# Patient Record
Sex: Male | Born: 1975 | Race: White | Hispanic: No | Smoking: Current every day smoker
Health system: Southern US, Community
[De-identification: ages and names within clinical notes are randomized; demographics above are authoritative.]

## PROBLEM LIST (undated history)

## (undated) DIAGNOSIS — J45909 Unspecified asthma, uncomplicated: Secondary | ICD-10-CM

## (undated) DIAGNOSIS — F419 Anxiety disorder, unspecified: Secondary | ICD-10-CM

## (undated) DIAGNOSIS — F410 Panic disorder [episodic paroxysmal anxiety] without agoraphobia: Secondary | ICD-10-CM

---

## 2005-12-26 ENCOUNTER — Emergency Department (HOSPITAL_COMMUNITY): Admission: EM | Admit: 2005-12-26 | Discharge: 2005-12-26 | Payer: Self-pay | Admitting: Emergency Medicine

## 2006-01-19 ENCOUNTER — Emergency Department (HOSPITAL_COMMUNITY): Admission: EM | Admit: 2006-01-19 | Discharge: 2006-01-19 | Payer: Self-pay | Admitting: Emergency Medicine

## 2006-01-21 ENCOUNTER — Emergency Department (HOSPITAL_COMMUNITY): Admission: EM | Admit: 2006-01-21 | Discharge: 2006-01-21 | Payer: Self-pay | Admitting: Emergency Medicine

## 2006-02-23 ENCOUNTER — Emergency Department (HOSPITAL_COMMUNITY): Admission: EM | Admit: 2006-02-23 | Discharge: 2006-02-23 | Payer: Self-pay | Admitting: Emergency Medicine

## 2006-06-18 ENCOUNTER — Emergency Department (HOSPITAL_COMMUNITY): Admission: EM | Admit: 2006-06-18 | Discharge: 2006-06-18 | Payer: Self-pay | Admitting: Emergency Medicine

## 2006-07-11 ENCOUNTER — Emergency Department (HOSPITAL_COMMUNITY): Admission: EM | Admit: 2006-07-11 | Discharge: 2006-07-11 | Payer: Self-pay | Admitting: Emergency Medicine

## 2006-08-03 ENCOUNTER — Emergency Department (HOSPITAL_COMMUNITY): Admission: EM | Admit: 2006-08-03 | Discharge: 2006-08-03 | Payer: Self-pay | Admitting: Emergency Medicine

## 2006-08-06 ENCOUNTER — Emergency Department (HOSPITAL_COMMUNITY): Admission: EM | Admit: 2006-08-06 | Discharge: 2006-08-06 | Payer: Self-pay | Admitting: Emergency Medicine

## 2006-08-07 ENCOUNTER — Emergency Department (HOSPITAL_COMMUNITY): Admission: EM | Admit: 2006-08-07 | Discharge: 2006-08-07 | Payer: Self-pay | Admitting: Emergency Medicine

## 2006-08-31 ENCOUNTER — Emergency Department (HOSPITAL_COMMUNITY): Admission: EM | Admit: 2006-08-31 | Discharge: 2006-08-31 | Payer: Self-pay | Admitting: Emergency Medicine

## 2006-09-14 ENCOUNTER — Emergency Department (HOSPITAL_COMMUNITY): Admission: EM | Admit: 2006-09-14 | Discharge: 2006-09-14 | Payer: Self-pay | Admitting: Emergency Medicine

## 2006-09-21 ENCOUNTER — Emergency Department (HOSPITAL_COMMUNITY): Admission: EM | Admit: 2006-09-21 | Discharge: 2006-09-21 | Payer: Self-pay | Admitting: Emergency Medicine

## 2006-09-29 ENCOUNTER — Emergency Department (HOSPITAL_COMMUNITY): Admission: EM | Admit: 2006-09-29 | Discharge: 2006-09-29 | Payer: Self-pay | Admitting: Emergency Medicine

## 2006-11-07 ENCOUNTER — Emergency Department (HOSPITAL_COMMUNITY): Admission: EM | Admit: 2006-11-07 | Discharge: 2006-11-07 | Payer: Self-pay | Admitting: Emergency Medicine

## 2011-09-28 ENCOUNTER — Encounter (HOSPITAL_COMMUNITY): Payer: Self-pay | Admitting: Emergency Medicine

## 2011-09-28 ENCOUNTER — Emergency Department (HOSPITAL_COMMUNITY)
Admission: EM | Admit: 2011-09-28 | Discharge: 2011-09-28 | Disposition: A | Discharge status: Home / Self Care | Payer: Self-pay | Attending: Emergency Medicine | Admitting: Emergency Medicine

## 2011-09-28 DIAGNOSIS — J329 Chronic sinusitis, unspecified: Secondary | ICD-10-CM | POA: Insufficient documentation

## 2011-09-28 DIAGNOSIS — R000 Tachycardia, unspecified: Secondary | ICD-10-CM | POA: Insufficient documentation

## 2011-09-28 DIAGNOSIS — F17200 Nicotine dependence, unspecified, uncomplicated: Secondary | ICD-10-CM | POA: Insufficient documentation

## 2011-09-28 MED ORDER — PREDNISONE 20 MG PO TABS
60.0000 mg | ORAL_TABLET | Freq: Once | ORAL | Status: AC
  Administered 2011-09-28: 60 mg via ORAL
  Filled 2011-09-28: qty 3

## 2011-09-28 MED ORDER — PSEUDOEPHEDRINE HCL 60 MG PO TABS
60.0000 mg | ORAL_TABLET | Freq: Once | ORAL | Status: AC
  Administered 2011-09-28: 60 mg via ORAL
  Filled 2011-09-28: qty 1

## 2011-09-28 MED ORDER — PREDNISONE 10 MG PO TABS: 20.0000 mg | ORAL_TABLET | Freq: Every day | ORAL | Status: DC

## 2011-09-28 MED ORDER — PSEUDOEPHEDRINE HCL 60 MG PO TABS: ORAL_TABLET | ORAL | Status: DC

## 2011-09-28 NOTE — ED Notes (Signed)
Pt c/o sinus pain/pressure and congestion since yesterday.

## 2011-09-28 NOTE — ED Provider Notes (Signed)
History     CSN: [id number]  Arrival date & time 09/28/11  4782   First MD Initiated Contact with Patient 09/28/11 [phone]      Chief Complaint  Patient presents with  . Facial Pain  . Nasal Congestion    (Consider location/radiation/quality/duration/timing/severity/associated sxs/prior treatment) HPI Comments: Patient is a 48 hour history of increasing sinus pain and pressure. Patient states he mostly feels it in the 4 head and below the eyes. Patient states he has tried over-the-counter nasal decongestant medications which only helped minimally. This is now interfering with his ability to do his work and he presents to the emergency department for assistance. His been no fever or reported. The patient is not having bloody mucus that he is coughing up. His been no unusual rash.  The history is provided by the patient.    History reviewed. No pertinent past medical history.  History reviewed. No pertinent past surgical history.  No family history on file.  History  Substance Use Topics  . Smoking status: Current Everyday Smoker  . Smokeless tobacco: Not on file  . Alcohol Use: Yes     occasional      Review of Systems  Constitutional: Negative for activity change.       All ROS Neg except as noted in HPI  HENT: Positive for congestion. Negative for nosebleeds and neck pain.   Eyes: Negative for photophobia and discharge.  Respiratory: Negative for cough, shortness of breath and wheezing.   Cardiovascular: Negative for chest pain and palpitations.  Gastrointestinal: Negative for abdominal pain and blood in stool.  Genitourinary: Negative for dysuria, frequency and hematuria.  Musculoskeletal: Negative for back pain and arthralgias.  Skin: Negative.   Neurological: Positive for headaches. Negative for dizziness, seizures and speech difficulty.  Psychiatric/Behavioral: Negative for hallucinations and confusion.    Allergies  Review of patient's allergies indicates no  known allergies.  Home Medications   Current Outpatient Rx  Name Route Sig Dispense Refill  . PHENYLEPHRINE-ACETAMINOPHEN 5-325 MG PO CAPS Oral Take 2 capsules by mouth every 6 (six) hours as needed. For sinus congestion and pain    . PREDNISONE 10 MG PO TABS Oral Take 2 tablets (20 mg total) by mouth daily. 12 tablet 0  . PSEUDOEPHEDRINE HCL 60 MG PO TABS  1 po tid for congestion 30 tablet 0    BP 137/79  Pulse 110  Temp 98.6 F (37 C)  Resp 18  Ht 6' (1.829 m)  Wt 206 lb (93.441 kg)  BMI 27.94 kg/m2  SpO2 97%  Physical Exam  Nursing note and vitals reviewed. Constitutional: He is oriented to person, place, and time. He appears well-developed and well-nourished.  Non-toxic appearance.  HENT:  Head: Normocephalic.  Right Ear: Tympanic membrane and external ear normal.  Left Ear: Tympanic membrane and external ear normal.       Nasal congestion present bilaterally. There is slight increased redness of the uvula and the posterior pharynx, no exudate.  Eyes: EOM and lids are normal. Pupils are equal, round, and reactive to light.  Neck: Normal range of motion. Neck supple. Carotid bruit is not present.  Cardiovascular: Regular rhythm, normal heart sounds, intact distal pulses and normal pulses.  Tachycardia present.   Pulmonary/Chest: Breath sounds normal. No respiratory distress.  Abdominal: Soft. Bowel sounds are normal. There is no tenderness. There is no guarding.  Musculoskeletal: Normal range of motion.  Lymphadenopathy:       Head (right side): No submandibular adenopathy present.  Head (left side): No submandibular adenopathy present.    He has no cervical adenopathy.  Neurological: He is alert and oriented to person, place, and time. He has normal strength. No cranial nerve deficit or sensory deficit.  Skin: Skin is warm and dry.  Psychiatric: He has a normal mood and affect. His speech is normal.    ED Course  Procedures (including critical care time)  Labs  Reviewed - No data to display No results found.   1. Sinusitis       MDM  I have reviewed nursing notes, vital signs, and all appropriate lab and imaging results for this patient. Patient has history of previous sinus related issues. He has had sinus pain and pressure for the last 48 hours. The patient is treated this time with Sudafed 3 times daily and prednisone daily for the next 5-6 days. Patient is advised to increase fluids, and to wash hands frequently. He is to see his primary physician next week if not improving.       Kathie Dike, [place] 09/28/11 [phone]

## 2011-09-29 NOTE — ED Provider Notes (Signed)
Medical screening examination/treatment/procedure(s) were performed by non-physician practitioner and as supervising physician I was immediately available for consultation/collaboration.  Shelda Jakes, MD 09/29/11 [phone]

## 2011-10-28 ENCOUNTER — Emergency Department (HOSPITAL_COMMUNITY)
Admission: EM | Admit: 2011-10-28 | Discharge: 2011-10-28 | Disposition: A | Discharge status: Home / Self Care | Payer: Self-pay | Attending: Emergency Medicine | Admitting: Emergency Medicine

## 2011-10-28 ENCOUNTER — Encounter (HOSPITAL_COMMUNITY): Payer: Self-pay | Admitting: Emergency Medicine

## 2011-10-28 ENCOUNTER — Emergency Department (HOSPITAL_COMMUNITY): Payer: Self-pay

## 2011-10-28 DIAGNOSIS — F17200 Nicotine dependence, unspecified, uncomplicated: Secondary | ICD-10-CM | POA: Insufficient documentation

## 2011-10-28 DIAGNOSIS — R002 Palpitations: Secondary | ICD-10-CM | POA: Insufficient documentation

## 2011-10-28 DIAGNOSIS — R0789 Other chest pain: Secondary | ICD-10-CM | POA: Insufficient documentation

## 2011-10-28 DIAGNOSIS — F410 Panic disorder [episodic paroxysmal anxiety] without agoraphobia: Secondary | ICD-10-CM | POA: Insufficient documentation

## 2011-10-28 LAB — COMPREHENSIVE METABOLIC PANEL
AST: 37 U/L (ref 0–37)
CO2: 27 mEq/L (ref 19–32)
Calcium: 9.3 mg/dL (ref 8.4–10.5)
Creatinine, Ser: 0.95 mg/dL (ref 0.50–1.35)
GFR calc non Af Amer: >90 mL/min (ref >90)
Sodium: 134 mEq/L — ABNORMAL LOW (ref 135–145)
Total Protein: 7.4 g/dL (ref 6.0–8.3)

## 2011-10-28 LAB — D-DIMER, QUANTITATIVE: D-Dimer, Quant: 0.31 ug/mL-FEU (ref 0.00–0.48)

## 2011-10-28 LAB — CBC WITH DIFFERENTIAL/PLATELET
Basophils Absolute: 0 10*3/uL (ref 0.0–0.1)
Basophils Relative: 0 % (ref 0–1)
Eosinophils Absolute: 0.4 10*3/uL (ref 0.0–0.7)
Eosinophils Relative: 6 % — ABNORMAL HIGH (ref 0–5)
HCT: 44.8 % (ref 39.0–52.0)
Lymphocytes Relative: 23 % (ref 12–46)
MCH: 31.2 pg (ref 26.0–34.0)
MCHC: 35 g/dL (ref 30.0–36.0)
MCV: 89.1 fL (ref 78.0–100.0)
Monocytes Absolute: 0.6 10*3/uL (ref 0.1–1.0)
Platelets: 236 10*3/uL (ref 150–400)
RDW: 12.4 % (ref 11.5–15.5)

## 2011-10-28 LAB — RAPID URINE DRUG SCREEN, HOSP PERFORMED
Benzodiazepines: NOT DETECTED
Opiates: NOT DETECTED

## 2011-10-28 LAB — URINALYSIS, ROUTINE W REFLEX MICROSCOPIC
Ketones, ur: NEGATIVE mg/dL
Leukocytes, UA: NEGATIVE
Nitrite: NEGATIVE
Protein, ur: NEGATIVE mg/dL
pH: 6 (ref 5.0–8.0)

## 2011-10-28 MED ORDER — SODIUM CHLORIDE 0.9 % IV BOLUS (SEPSIS)
1000.0000 mL | Freq: Once | INTRAVENOUS | Status: AC
  Administered 2011-10-28: 1000 mL via INTRAVENOUS

## 2011-10-28 MED ORDER — LORAZEPAM 1 MG PO TABS
1.0000 mg | ORAL_TABLET | Freq: Once | ORAL | Status: AC
  Administered 2011-10-28: 1 mg via ORAL
  Filled 2011-10-28: qty 1

## 2011-10-28 NOTE — ED Notes (Signed)
Pt c/o tachycardia and chest discomfort that began earlier today. Pt states he felt like his "heart was going to beat out of my chest". Pt's HR upon EMS arrival was 140 and is now 120s. Pt states he's had similar symptoms previously but has never been treated.

## 2011-10-28 NOTE — ED Provider Notes (Signed)
History   This chart was scribed for Shane Octave, MD by Gerlean Ren. This patient was seen in room [facility]07/[facility]07 and the patient's care was started at 5:23PM.   CSN: [id number]  Arrival date & time 10/28/11  1714   First MD Initiated Contact with Patient 10/28/11 1720      Chief Complaint  Patient presents with  . Tachycardia    (Consider location/radiation/quality/duration/timing/severity/associated sxs/prior treatment) The history is provided by the patient. No language interpreter was used.   Shane Castillo is a 36 y.o. male brought in by ambulance, who presents to the Emergency Department complaining of racing heart rate with sudden onset 6 hours ago while driving described as similar in type, but more severe than, panic attacks pt has been having monthly for past year.  Pt immediately pulled over and had near-syncope (no LOC) when getting out of car.  Pt reports passenger's voice "sounded distant" when tachycardia started.  Per EMS HR was 140, currently 112.  Pt currently does not feel heart racing as before.  Pt reports drinking approx 60oz of coffee today, but that this is normal intake.  Pt was smoking when attack began.  Pt has not sought previous medical treatment for panic attacks.  Pt denies visual disturbances, changes in appetite or liquid intake, fever, neck pain, sore throat, CP, cough, SOB, abdominal pain, nausea, emesis, diarrhea, urinary symptoms, back pain, HA, numbness and rash as associated symptoms.  Pt is a current everyday smoker and reports occasional alcohol use.     History reviewed. No pertinent past medical history.  History reviewed. No pertinent past surgical history.  History reviewed. No pertinent family history.  History  Substance Use Topics  . Smoking status: Current Every Day Smoker  . Smokeless tobacco: Not on file  . Alcohol Use: Yes     occasional      Review of Systems A complete 10 system review of systems was obtained and all systems  are negative except as noted in the HPI and PMH.   Allergies  Review of patient's allergies indicates no known allergies.  Home Medications   Current Outpatient Rx  Name Route Sig Dispense Refill  . PHENYLEPHRINE-ACETAMINOPHEN 5-325 MG PO CAPS Oral Take 2 capsules by mouth every 6 (six) hours as needed. For sinus congestion and pain    . PREDNISONE 10 MG PO TABS Oral Take 2 tablets (20 mg total) by mouth daily. 12 tablet 0  . PSEUDOEPHEDRINE HCL 60 MG PO TABS  1 po tid for congestion 30 tablet 0    BP 112/66  Pulse 93  Resp 20  SpO2 96%  Physical Exam  Nursing note and vitals reviewed. Constitutional: He is oriented to person, place, and time. He appears well-developed.  HENT:  Head: Normocephalic and atraumatic.  Eyes: Conjunctivae normal are normal.  Neck: Normal range of motion. No tracheal deviation present.  Cardiovascular: Regular rhythm and normal heart sounds.   No murmur heard.      Tachycardic.    Pulmonary/Chest: Effort normal and breath sounds normal. He has no wheezes.  Abdominal: Soft. There is no tenderness.  Musculoskeletal: Normal range of motion. He exhibits no edema.  Neurological: He is alert and oriented to person, place, and time.  Skin: Skin is warm.  Psychiatric: He has a normal mood and affect.    ED Course  Procedures (including critical care time)  DIAGNOSTIC STUDIES: Oxygen Saturation is 96% on room air, normal by my interpretation.    COORDINATION OF  CARE: 5:30PM- Ordered ativan, CBC, c-met, troponin, d-dimer, urinalysis, TSH, T4, and chest XR.     Labs Reviewed - No data to display No results found. Results for orders placed during the hospital encounter of 10/28/11  CBC WITH DIFFERENTIAL      Component Value Range   WBC 7.6  4.0 - 10.5 K/uL   RBC 5.03  4.22 - 5.81 MIL/uL   Hemoglobin 15.7  13.0 - 17.0 g/dL   HCT 16.1  09.6 - 04.5 %   MCV 89.1  78.0 - 100.0 fL   MCH 31.2  26.0 - 34.0 pg   MCHC 35.0  30.0 - 36.0 g/dL   RDW 40.9   81.1 - 91.4 %   Platelets 236  150 - 400 K/uL   Neutrophils Relative 64  43 - 77 %   Neutro Abs 4.8  1.7 - 7.7 K/uL   Lymphocytes Relative 23  12 - 46 %   Lymphs Abs 1.8  0.7 - 4.0 K/uL   Monocytes Relative 7  3 - 12 %   Monocytes Absolute 0.6  0.1 - 1.0 K/uL   Eosinophils Relative 6 (*) 0 - 5 %   Eosinophils Absolute 0.4  0.0 - 0.7 K/uL   Basophils Relative 0  0 - 1 %   Basophils Absolute 0.0  0.0 - 0.1 K/uL  COMPREHENSIVE METABOLIC PANEL      Component Value Range   Sodium 134 (*) 135 - 145 mEq/L   Potassium 3.5  3.5 - 5.1 mEq/L   Chloride 99  96 - 112 mEq/L   CO2 27  19 - 32 mEq/L   Glucose, Bld 116 (*) 70 - 99 mg/dL   BUN 17  6 - 23 mg/dL   Creatinine, Ser 7.82  0.50 - 1.35 mg/dL   Calcium 9.3  8.4 - 95.6 mg/dL   Total Protein 7.4  6.0 - 8.3 g/dL   Albumin 3.7  3.5 - 5.2 g/dL   AST 37  0 - 37 U/L   ALT 39  0 - 53 U/L   Alkaline Phosphatase 93  39 - 117 U/L   Total Bilirubin 0.2 (*) 0.3 - 1.2 mg/dL   GFR calc non Af Amer >90  >90 mL/min   GFR calc Af Amer >90  >90 mL/min  TROPONIN I      Component Value Range   Troponin I <0.30  <0.30 ng/mL  D-DIMER, QUANTITATIVE      Component Value Range   D-Dimer, Quant 0.31  0.00 - 0.48 ug/mL-FEU  URINALYSIS, ROUTINE W REFLEX MICROSCOPIC      Component Value Range   Color, Urine YELLOW  YELLOW   APPearance CLEAR  CLEAR   Specific Gravity, Urine >1.030 (*) 1.005 - 1.030   pH 6.0  5.0 - 8.0   Glucose, UA NEGATIVE  NEGATIVE mg/dL   Hgb urine dipstick NEGATIVE  NEGATIVE   Bilirubin Urine NEGATIVE  NEGATIVE   Ketones, ur NEGATIVE  NEGATIVE mg/dL   Protein, ur NEGATIVE  NEGATIVE mg/dL   Urobilinogen, UA 0.2  0.0 - 1.0 mg/dL   Nitrite NEGATIVE  NEGATIVE   Leukocytes, UA NEGATIVE  NEGATIVE    Dg Chest 2 View  10/28/2011  *RADIOLOGY REPORT*  Clinical Data: Tachycardia.  Chest discomfort.  CHEST - 2 VIEW  Comparison: None.  Findings: Lateral costophrenic angles omitted.  Posterior costophrenic angles appear sharp.  Lungs appear  clear. Cardiac and mediastinal contours appear unremarkable.  IMPRESSION:  1.  No significant  abnormality identified.   Original Report Authenticated By: Dellia Cloud, M.D.     No diagnosis found.    MDM  Palpitations, racing heart, nausea, diaphoresis, vision changes x 10 minutes, now resolved.  No CP or SOB.  Hx similar "attacks" monthly but never lasted this long. Heavy caffeine use, denies drug use or med changes.  Not taking sudafed prescribed last week.  EKG was sinus tachycardia. Chest x-ray negative. Troponin negative, d-dimer negative. Heart rate is improved spontaneously from 110s to 80s after at home. Patient no distress. Counseled on the nature of symptoms. Advised to slowly decrease caffeine intake establish care with PCP.   Date: 10/28/2011  Rate: 104  Rhythm: sinus tachycardia  QRS Axis: normal  Intervals: normal  ST/T Wave abnormalities: nonspecific ST/T changes  Conduction Disutrbances:none  Narrative Interpretation:   Old EKG Reviewed: none available    I personally performed the services described in this documentation, which was scribed in my presence.  The recorded information has been reviewed and considered.       Shane Octave, MD 10/28/11 1944

## 2011-10-29 LAB — TSH: TSH: 0.673 u[IU]/mL (ref 0.350–4.500)

## 2011-10-29 LAB — T4, FREE: Free T4: 1.09 ng/dL (ref 0.80–1.80)

## 2012-05-10 ENCOUNTER — Encounter (HOSPITAL_COMMUNITY): Payer: Self-pay | Admitting: Emergency Medicine

## 2012-05-10 ENCOUNTER — Emergency Department (HOSPITAL_COMMUNITY)
Admission: EM | Admit: 2012-05-10 | Discharge: 2012-05-10 | Disposition: A | Discharge status: Home / Self Care | Payer: Self-pay | Attending: Emergency Medicine | Admitting: Emergency Medicine

## 2012-05-10 DIAGNOSIS — IMO0001 Reserved for inherently not codable concepts without codable children: Secondary | ICD-10-CM | POA: Insufficient documentation

## 2012-05-10 DIAGNOSIS — Z8659 Personal history of other mental and behavioral disorders: Secondary | ICD-10-CM | POA: Insufficient documentation

## 2012-05-10 DIAGNOSIS — R509 Fever, unspecified: Secondary | ICD-10-CM | POA: Insufficient documentation

## 2012-05-10 DIAGNOSIS — J020 Streptococcal pharyngitis: Secondary | ICD-10-CM | POA: Insufficient documentation

## 2012-05-10 DIAGNOSIS — R05 Cough: Secondary | ICD-10-CM | POA: Insufficient documentation

## 2012-05-10 DIAGNOSIS — F17200 Nicotine dependence, unspecified, uncomplicated: Secondary | ICD-10-CM | POA: Insufficient documentation

## 2012-05-10 DIAGNOSIS — R059 Cough, unspecified: Secondary | ICD-10-CM | POA: Insufficient documentation

## 2012-05-10 DIAGNOSIS — R51 Headache: Secondary | ICD-10-CM | POA: Insufficient documentation

## 2012-05-10 DIAGNOSIS — J45909 Unspecified asthma, uncomplicated: Secondary | ICD-10-CM | POA: Insufficient documentation

## 2012-05-10 HISTORY — DX: Panic disorder (episodic paroxysmal anxiety): F41.0

## 2012-05-10 HISTORY — DX: Anxiety disorder, unspecified: F41.9

## 2012-05-10 HISTORY — DX: Unspecified asthma, uncomplicated: J45.909

## 2012-05-10 MED ORDER — AMOXICILLIN 500 MG PO CAPS: 500.0000 mg | ORAL_CAPSULE | Freq: Three times a day (TID) | ORAL | Status: DC

## 2012-05-10 MED ORDER — IBUPROFEN 800 MG PO TABS
800.0000 mg | ORAL_TABLET | Freq: Once | ORAL | Status: AC
  Administered 2012-05-10: 800 mg via ORAL
  Filled 2012-05-10: qty 1

## 2012-05-10 MED ORDER — PENICILLIN V POTASSIUM 250 MG PO TABS
500.0000 mg | ORAL_TABLET | Freq: Once | ORAL | Status: AC
  Administered 2012-05-10: 500 mg via ORAL
  Filled 2012-05-10: qty 2

## 2012-05-10 NOTE — ED Notes (Signed)
Patient c/o sore throat with productive cough and sinus pressure. Per patient sputum dark and thick. Patient reports taking ibuprofen. Denies taking any this morning. Per patient possible fever yesterday.

## 2012-05-10 NOTE — ED Notes (Signed)
Patient with no complaints at this time. Respirations even and unlabored. Skin warm/dry. Discharge instructions reviewed with patient at this time. Patient given opportunity to voice concerns/ask questions. Patient discharged at this time and left Emergency Department with steady gait.   

## 2012-05-10 NOTE — ED Provider Notes (Signed)
History     CSN: [id number]  Arrival date & time 05/10/12  0847   First MD Initiated Contact with Patient 05/10/12 [phone]      Chief Complaint  Patient presents with  . Sore Throat    (Consider location/radiation/quality/duration/timing/severity/associated sxs/prior treatment) Patient is a 37 y.o. male presenting with pharyngitis. The history is provided by the patient.  Sore Throat This is a new problem. The current episode started in the past 7 days. The problem occurs constantly. The problem has been unchanged. Associated symptoms include arthralgias, a fever and headaches. Pertinent negatives include no abdominal pain, chest pain, coughing, neck pain or rash. The symptoms are aggravated by swallowing and coughing. Treatments tried: OTC cough meds.    Past Medical History  Diagnosis Date  . Asthma   . Panic attack   . Anxiety     History reviewed. No pertinent past surgical history.  History reviewed. No pertinent family history.  History  Substance Use Topics  . Smoking status: Current Every Day Smoker -- 0.75 packs/day for 20 years    Types: Cigarettes  . Smokeless tobacco: Never Used  . Alcohol Use: Yes     Comment: occasional      Review of Systems  Constitutional: Positive for fever. Negative for activity change.       All ROS Neg except as noted in HPI  HENT: Negative for nosebleeds and neck pain.   Eyes: Negative for photophobia and discharge.  Respiratory: Negative for cough, shortness of breath and wheezing.   Cardiovascular: Negative for chest pain and palpitations.  Gastrointestinal: Negative for abdominal pain and blood in stool.  Genitourinary: Negative for dysuria, frequency and hematuria.  Musculoskeletal: Positive for arthralgias. Negative for back pain.  Skin: Negative.  Negative for rash.  Neurological: Positive for headaches. Negative for dizziness, seizures and speech difficulty.  Psychiatric/Behavioral: Negative for hallucinations and  confusion.    Allergies  Review of patient's allergies indicates no known allergies.  Home Medications   Current Outpatient Rx  Name  Route  Sig  Dispense  Refill  . DM-Doxylamine-Acetaminophen (NYQUIL COLD & FLU PO)   Oral   Take 10 mLs by mouth at bedtime as needed.         Marland Kitchen ibuprofen (ADVIL,MOTRIN) 200 MG tablet   Oral   Take 800 mg by mouth every 8 (eight) hours as needed for pain.           BP 128/82  Pulse 98  Temp(Src) 98.7 F (37.1 C) (Oral)  Resp 16  Ht 6' (1.829 m)  Wt 215 lb (97.523 kg)  BMI 29.15 kg/m2  SpO2 98%  Physical Exam  Nursing note and vitals reviewed. Constitutional: He is oriented to person, place, and time. He appears well-developed and well-nourished.  Non-toxic appearance.  HENT:  Head: Normocephalic.  Right Ear: Tympanic membrane and external ear normal.  Left Ear: Tympanic membrane and external ear normal.  Mouth/Throat: Edematous present. Posterior oropharyngeal erythema present.  Eyes: EOM and lids are normal. Pupils are equal, round, and reactive to light.  Neck: Normal range of motion. Neck supple. Carotid bruit is not present.  Cardiovascular: Normal rate, regular rhythm, normal heart sounds, intact distal pulses and normal pulses.   Pulmonary/Chest: Breath sounds normal. No respiratory distress.  Abdominal: Soft. Bowel sounds are normal. There is no tenderness. There is no guarding.  Musculoskeletal: Normal range of motion.  Lymphadenopathy:       Head (right side): No submandibular adenopathy present.  Head (left side): No submandibular adenopathy present.    He has no cervical adenopathy.  Neurological: He is alert and oriented to person, place, and time. He has normal strength. No cranial nerve deficit or sensory deficit.  Skin: Skin is warm and dry.  Psychiatric: He has a normal mood and affect. His speech is normal.    ED Course  Procedures (including critical care time)  Labs Reviewed  RAPID STREP SCREEN -  Abnormal; Notable for the following:    Streptococcus, Group A Screen (Direct) POSITIVE (*)    All other components within normal limits   No results found.   No diagnosis found.    MDM  I have reviewed nursing notes, vital signs, and all appropriate lab and imaging results for this patient. Pt presents to ED with complaint of sore throat, cough, and generally not felling well. Vital signs stable. Strep test is positive. Plan- Pt advised to wash hands frequently. Use amoxil tid. Salt water gargles. Pt to return to ED or see PCP for recheck if not improving.      Kathie Dike, PA-C 05/13/12 1422

## 2012-05-13 NOTE — ED Provider Notes (Signed)
Medical screening examination/treatment/procedure(s) were performed by non-physician practitioner and as supervising physician I was immediately available for consultation/collaboration.   Joya Gaskins, MD 05/13/12 [phone]

## 2014-09-18 ENCOUNTER — Emergency Department (HOSPITAL_COMMUNITY)
Admission: EM | Admit: 2014-09-18 | Discharge: 2014-09-18 | Disposition: A | Discharge status: Home / Self Care | Payer: Self-pay | Attending: Emergency Medicine | Admitting: Emergency Medicine

## 2014-09-18 ENCOUNTER — Encounter (HOSPITAL_COMMUNITY): Payer: Self-pay | Admitting: *Deleted

## 2014-09-18 DIAGNOSIS — Z720 Tobacco use: Secondary | ICD-10-CM | POA: Insufficient documentation

## 2014-09-18 DIAGNOSIS — K088 Other specified disorders of teeth and supporting structures: Secondary | ICD-10-CM | POA: Insufficient documentation

## 2014-09-18 DIAGNOSIS — K0889 Other specified disorders of teeth and supporting structures: Secondary | ICD-10-CM

## 2014-09-18 DIAGNOSIS — K0381 Cracked tooth: Secondary | ICD-10-CM | POA: Insufficient documentation

## 2014-09-18 DIAGNOSIS — Z8659 Personal history of other mental and behavioral disorders: Secondary | ICD-10-CM | POA: Insufficient documentation

## 2014-09-18 DIAGNOSIS — Z792 Long term (current) use of antibiotics: Secondary | ICD-10-CM | POA: Insufficient documentation

## 2014-09-18 DIAGNOSIS — J45909 Unspecified asthma, uncomplicated: Secondary | ICD-10-CM | POA: Insufficient documentation

## 2014-09-18 MED ORDER — CEPHALEXIN 500 MG PO CAPS: 500.0000 mg | ORAL_CAPSULE | Freq: Two times a day (BID) | ORAL | Status: DC

## 2014-09-18 NOTE — ED Notes (Signed)
Pt c/o pain to his wisdom teeth.

## 2014-09-18 NOTE — ED Provider Notes (Signed)
CSN: [id number]     Arrival date & time 09/18/14  0631 History   First MD Initiated Contact with Patient 09/18/14 0636     Chief Complaint  Patient presents with  . Dental Pain     (Consider location/radiation/quality/duration/timing/severity/associated sxs/prior Treatment) HPI Patient presents with worsening dental pain after eating last night. Complains of mild facial swelling. No fever or chills. Patient's been taking ibuprofen at home with minimal relief. Past Medical History  Diagnosis Date  . Asthma   . Panic attack   . Anxiety    History reviewed. No pertinent past surgical history. History reviewed. No pertinent family history. Social History  Substance Use Topics  . Smoking status: Current Every Day Smoker -- 0.75 packs/day for 20 years    Types: Cigarettes  . Smokeless tobacco: Never Used  . Alcohol Use: Yes     Comment: occasional    Review of Systems  Constitutional: Negative for fever and chills.  HENT: Positive for dental problem.   All other systems reviewed and are negative.     Allergies  Aspirin  Home Medications   Prior to Admission medications   Medication Sig Start Date End Date Taking? Authorizing Provider  amoxicillin (AMOXIL) 500 MG capsule Take 1 capsule (500 mg total) by mouth 3 (three) times daily. 05/10/12   Ivery Quale, PA-C  cephALEXin (KEFLEX) 500 MG capsule Take 1 capsule (500 mg total) by mouth 2 (two) times daily. 09/18/14   Loren Racer, MD  DM-Doxylamine-Acetaminophen (NYQUIL COLD & FLU PO) Take 10 mLs by mouth at bedtime as needed.    Historical Provider, MD  ibuprofen (ADVIL,MOTRIN) 200 MG tablet Take 800 mg by mouth every 8 (eight) hours as needed for pain.    Historical Provider, MD   BP 155/98 mmHg  Pulse 83  Temp(Src) 98.3 F (36.8 C) (Oral)  Resp 16  Ht  (1.753 m)  Wt 225 lb (102.059 kg)  BMI 33.21 kg/m2  SpO2 98% Physical Exam  Constitutional: He is oriented to person, place, and time. He appears  well-developed and well-nourished. No distress.  HENT:  Head: Normocephalic and atraumatic.  Mouth/Throat: Oropharynx is clear and moist.    Eyes: EOM are normal. Pupils are equal, round, and reactive to light.  Neck: Normal range of motion. Neck supple.  Cardiovascular: Normal rate.   Pulmonary/Chest: Effort normal.  Abdominal: Soft.  Musculoskeletal: Normal range of motion. He exhibits no edema or tenderness.  Neurological: He is alert and oriented to person, place, and time.  Ambulating without difficulty  Skin: Skin is warm and dry. No rash noted. No erythema.  Psychiatric: He has a normal mood and affect. His behavior is normal.  Nursing note and vitals reviewed.   ED Course  Procedures (including critical care time) Labs Review Labs Reviewed - No data to display  Imaging Review No results found. I have personally reviewed and evaluated these images and lab results as part of my medical decision-making.   EKG Interpretation None      MDM   Final diagnoses:  Pain, dental    Patient was offered dental block but refused. I advised follow-up with his dentist for extraction of his molars. He may also take Tylenol in addition to his ibuprofen.    Loren Racer, MD 09/18/14 [phone]

## 2014-09-18 NOTE — Discharge Instructions (Signed)

## 2016-04-18 ENCOUNTER — Emergency Department (HOSPITAL_COMMUNITY)
Admission: EM | Admit: 2016-04-18 | Discharge: 2016-04-18 | Disposition: A | Discharge status: Home / Self Care | Payer: Self-pay | Attending: Emergency Medicine | Admitting: Emergency Medicine

## 2016-04-18 ENCOUNTER — Encounter (HOSPITAL_COMMUNITY): Payer: Self-pay

## 2016-04-18 DIAGNOSIS — M545 Low back pain: Secondary | ICD-10-CM | POA: Insufficient documentation

## 2016-04-18 DIAGNOSIS — J45909 Unspecified asthma, uncomplicated: Secondary | ICD-10-CM | POA: Insufficient documentation

## 2016-04-18 DIAGNOSIS — F17210 Nicotine dependence, cigarettes, uncomplicated: Secondary | ICD-10-CM | POA: Insufficient documentation

## 2016-04-18 DIAGNOSIS — Z79899 Other long term (current) drug therapy: Secondary | ICD-10-CM | POA: Insufficient documentation

## 2016-04-18 MED ORDER — METHOCARBAMOL 500 MG PO TABS
1500.0000 mg | ORAL_TABLET | ORAL | Status: AC
  Administered 2016-04-18: 1500 mg via ORAL
  Filled 2016-04-18: qty 3

## 2016-04-18 MED ORDER — LIDOCAINE 5 % EX PTCH: 1.0000 | MEDICATED_PATCH | CUTANEOUS | 0 refills | Status: DC

## 2016-04-18 NOTE — ED Provider Notes (Signed)
[facility] DEPT Provider Note   CSN: [id number] Arrival date & time: 04/18/16  0906     History   Chief Complaint Chief Complaint  Patient presents with  . Back Pain    HPI Shane Castillo is a 41 y.o. male.  HPI   Pt with [facility] presents today with increase in left lower back pain that began when he was picking up wood to bring in the house.  States when he stood up from a bent over position he felt a "catch" in his back.This occurred at 6:30am.  He took 800mg  ibuprofen without improvement.  The pain radiates into the left leg.   Denies fevers, chills, abdominal pain, loss of control of bowel or bladder, weakness of numbness of the extremities, saddle anesthesia, bowel or urinary complaints.  No hx CA, IVDU.    [facility] but has not filled it because he is waiting for his check to come in (should happen today).    Past Medical History:  Diagnosis Date  . Anxiety   . Asthma   . Panic attack     There are no active problems to display for this patient.   History reviewed. No pertinent surgical history.     Home Medications    Prior to Admission medications   Medication Sig Start Date End Date Taking? Authorizing Provider  amoxicillin (AMOXIL) 500 MG capsule Take 1 capsule (500 mg total) by mouth 3 (three) times daily. 05/10/12   Ivery QualeHobson Bryant, PA-C  cephALEXin (KEFLEX) 500 MG capsule Take 1 capsule (500 mg total) by mouth 2 (two) times daily. 09/18/14   Loren Raceravid Yelverton, MD  DM-Doxylamine-Acetaminophen (NYQUIL COLD & FLU PO) Take 10 mLs by mouth at bedtime as needed.    Historical Provider, MD  ibuprofen (ADVIL,MOTRIN) 200 MG tablet Take 800 mg by mouth every 8 (eight) hours as needed for pain.    Historical Provider, MD  lidocaine (LIDODERM) 5 % Place 1 patch onto the skin daily. Remove & Discard patch within 12 hours or as directed by MD 04/18/16   Trixie DredgeEmily Quynn Vilchis,  PA-C    Family History No family history on file.  Social History Social History  Substance Use Topics  . Smoking status: Current Every Day Smoker    Packs/day: 0.75    Years: 20.00    Types: Cigarettes  . Smokeless tobacco: Never Used  . Alcohol use Yes     Comment: occasional     Allergies   Aspirin   Review of Systems Review of Systems  Constitutional: Negative for appetite change, chills and fever.  Gastrointestinal: Negative for abdominal pain, blood in stool, constipation and diarrhea.  Genitourinary: Negative for dysuria, frequency and urgency.  Musculoskeletal: Positive for back pain. Negative for gait problem.  Skin: Negative for color change and wound.  Allergic/Immunologic: Negative for immunocompromised state.  Neurological: Negative for weakness and numbness.  Hematological: Does not bruise/bleed easily.     Physical Exam Updated Vital Signs BP 130/84 (BP Location: Left Arm)   Pulse 88   Temp 98.3 F (36.8 C) (Oral)   Resp 18   Ht 6' (1.829 m)   Wt 90.7 kg   SpO2 99%   BMI 27.12 kg/m   Physical Exam  Constitutional: He appears well-developed and well-nourished. No distress.  HENT:  Head: Normocephalic and atraumatic.  Neck: Neck supple.  Pulmonary/Chest: Effort normal.  Abdominal: Soft. He exhibits  no distension. There is no tenderness. There is no rebound and no guarding.  Musculoskeletal:       Lumbar back: He exhibits tenderness, pain and spasm. He exhibits no bony tenderness.       Back:  Spine nontender, no crepitus, or stepoffs. Lower extremities:  Strength 5/5, sensation intact, distal pulses intact.     Neurological: He is alert.  Gait is normal.   Skin: He is not diaphoretic.  Nursing note and vitals reviewed.    ED Treatments / Results  Labs (all labs ordered are listed, but only abnormal results are displayed) Labs Reviewed - No data to display  EKG  EKG Interpretation None       Radiology No results  found.  Procedures Procedures (including critical care time)  Medications Ordered in ED Medications  methocarbamol (ROBAXIN) tablet 1,500 mg (1,500 mg Oral Given 04/18/16 0933)     Initial Impression / Assessment and Plan / ED Course  I have reviewed the triage vital signs and the nursing notes.  Pertinent labs & imaging results that were available during my care of the patient were reviewed by me and considered in my medical decision making (see chart for details).   MRI and recent ED visit not available through care everywhere  Afebrile nontoxic patient with known lumbar disc problem p/w left lower back pain after bending over to pick up heavy log.  No red flags on exam.  Pt has definite reproducible tenderness and spasm in the musculature.  No neurologic symptoms or deficits.  Pt has flexeril prescription he has not yet filled.  Robaxin given in ED.  D/C home with coupon for flexeril he was previously prescribed, Rx lidoderm patches, continued motrin and advised conservative treatments at home.  PCP follow up.  Discussed result, findings, treatment, and follow up  with patient.  Pt given return precautions.  Pt verbalizes understanding and agrees with plan.      Final Clinical Impressions(s) / ED Diagnoses   Final diagnoses:  Acute left-sided low back pain, with sciatica presence unspecified    New Prescriptions Discharge Medication List as of 04/18/2016  9:39 AM    START taking these medications   Details  lidocaine (LIDODERM) 5 % Place 1 patch onto the skin daily. Remove & Discard patch within 12 hours or as directed by MD, Starting Fri 04/18/2016, Print         [place], PA-C 04/18/16 1020    Marily Memos, MD 04/19/16 [phone]

## 2016-04-18 NOTE — Discharge Instructions (Signed)
Read the information below.  Use the prescribed medication as directed.  Please discuss all new medications with your pharmacist.  You may return to the Emergency Department at any time for worsening condition or any new symptoms that concern you.  If you develop fevers, loss of control of bowel or bladder, weakness or numbness in your legs, or are unable to walk, return to the ER for a recheck.   Continue to take 800mg  (4 pills) of ibuprofen three times daily with food.  Fill your flexeril prescription when you can.  I have attached a coupon for you.  Use ice/heat and gentle stretching - see attached information for recommendations.

## 2016-04-18 NOTE — ED Notes (Signed)
Pt made aware to return if symptoms worsen or if any life threatening symptoms occur.   
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
## 2016-07-03 DIAGNOSIS — F410 Panic disorder [episodic paroxysmal anxiety] without agoraphobia: Secondary | ICD-10-CM | POA: Insufficient documentation

## 2016-07-03 DIAGNOSIS — Z720 Tobacco use: Secondary | ICD-10-CM | POA: Insufficient documentation

## 2016-07-04 ENCOUNTER — Ambulatory Visit: Payer: Self-pay | Admitting: Family Medicine

## 2016-08-30 ENCOUNTER — Emergency Department (HOSPITAL_COMMUNITY): Payer: Self-pay

## 2016-08-30 ENCOUNTER — Encounter (HOSPITAL_COMMUNITY): Payer: Self-pay | Admitting: Cardiology

## 2016-08-30 ENCOUNTER — Emergency Department (HOSPITAL_COMMUNITY)
Admission: EM | Admit: 2016-08-30 | Discharge: 2016-08-30 | Disposition: A | Discharge status: Home / Self Care | Payer: Self-pay | Attending: Emergency Medicine | Admitting: Emergency Medicine

## 2016-08-30 DIAGNOSIS — R079 Chest pain, unspecified: Secondary | ICD-10-CM | POA: Insufficient documentation

## 2016-08-30 DIAGNOSIS — R059 Cough, unspecified: Secondary | ICD-10-CM

## 2016-08-30 DIAGNOSIS — R05 Cough: Secondary | ICD-10-CM | POA: Insufficient documentation

## 2016-08-30 DIAGNOSIS — R61 Generalized hyperhidrosis: Secondary | ICD-10-CM | POA: Insufficient documentation

## 2016-08-30 DIAGNOSIS — J45909 Unspecified asthma, uncomplicated: Secondary | ICD-10-CM | POA: Insufficient documentation

## 2016-08-30 DIAGNOSIS — F17210 Nicotine dependence, cigarettes, uncomplicated: Secondary | ICD-10-CM | POA: Insufficient documentation

## 2016-08-30 LAB — CBC WITH DIFFERENTIAL/PLATELET
BASOS ABS: 0 10*3/uL (ref 0.0–0.1)
BASOS PCT: 0 %
EOS ABS: 0.6 10*3/uL (ref 0.0–0.7)
EOS PCT: 7 %
HCT: 51.9 % (ref 39.0–52.0)
Hemoglobin: 18.3 g/dL — ABNORMAL HIGH (ref 13.0–17.0)
LYMPHS PCT: 19 %
Lymphs Abs: 1.7 10*3/uL (ref 0.7–4.0)
MCH: 33.2 pg (ref 26.0–34.0)
MCHC: 35.3 g/dL (ref 30.0–36.0)
MCV: 94 fL (ref 78.0–100.0)
Monocytes Absolute: 0.7 10*3/uL (ref 0.1–1.0)
Monocytes Relative: 8 %
Neutro Abs: 5.9 10*3/uL (ref 1.7–7.7)
Neutrophils Relative %: 66 %
PLATELETS: 240 10*3/uL (ref 150–400)
RBC: 5.52 MIL/uL (ref 4.22–5.81)
RDW: 12.1 % (ref 11.5–15.5)
WBC: 9 10*3/uL (ref 4.0–10.5)

## 2016-08-30 LAB — COMPREHENSIVE METABOLIC PANEL
ALBUMIN: 4.1 g/dL (ref 3.5–5.0)
ALT: 28 U/L (ref 17–63)
AST: 25 U/L (ref 15–41)
Alkaline Phosphatase: 73 U/L (ref 38–126)
Anion gap: 7 (ref 5–15)
BUN: 12 mg/dL (ref 6–20)
CHLORIDE: 103 mmol/L (ref 101–111)
CO2: 29 mmol/L (ref 22–32)
CREATININE: 0.89 mg/dL (ref 0.61–1.24)
Calcium: 9.3 mg/dL (ref 8.9–10.3)
GFR calc Af Amer: >60 mL/min (ref >60)
GFR calc non Af Amer: >60 mL/min (ref >60)
GLUCOSE: 91 mg/dL (ref 65–99)
Potassium: 3.9 mmol/L (ref 3.5–5.1)
SODIUM: 139 mmol/L (ref 135–145)
Total Bilirubin: 0.7 mg/dL (ref 0.3–1.2)
Total Protein: 7.7 g/dL (ref 6.5–8.1)

## 2016-08-30 LAB — D-DIMER, QUANTITATIVE: D-Dimer, Quant: <0.27 ug/mL-FEU (ref 0.00–0.50)

## 2016-08-30 LAB — TROPONIN I: Troponin I: <0.03 ng/mL (ref <0.03)

## 2016-08-30 MED ORDER — PREDNISONE 50 MG PO TABS
60.0000 mg | ORAL_TABLET | Freq: Once | ORAL | Status: AC
  Administered 2016-08-30: 18:00:00 60 mg via ORAL
  Filled 2016-08-30: qty 1

## 2016-08-30 MED ORDER — DOXYCYCLINE HYCLATE 100 MG PO CAPS: 100.0000 mg | ORAL_CAPSULE | Freq: Two times a day (BID) | ORAL | 0 refills | Status: DC

## 2016-08-30 MED ORDER — KETOROLAC TROMETHAMINE 30 MG/ML IJ SOLN
15.0000 mg | Freq: Once | INTRAMUSCULAR | Status: AC
  Administered 2016-08-30: 15 mg via INTRAVENOUS
  Filled 2016-08-30: qty 1

## 2016-08-30 MED ORDER — IOPAMIDOL (ISOVUE-300) INJECTION 61%
75.0000 mL | Freq: Once | INTRAVENOUS | Status: AC | PRN
  Administered 2016-08-30: 75 mL via INTRAVENOUS

## 2016-08-30 MED ORDER — IPRATROPIUM-ALBUTEROL 0.5-2.5 (3) MG/3ML IN SOLN
3.0000 mL | Freq: Once | RESPIRATORY_TRACT | Status: AC
  Administered 2016-08-30: 3 mL via RESPIRATORY_TRACT
  Filled 2016-08-30: qty 3

## 2016-08-30 MED ORDER — AZITHROMYCIN 250 MG PO TABS
500.0000 mg | ORAL_TABLET | Freq: Once | ORAL | Status: AC
  Administered 2016-08-30: 500 mg via ORAL
  Filled 2016-08-30: qty 2

## 2016-08-30 MED ORDER — ALBUTEROL SULFATE HFA 108 (90 BASE) MCG/ACT IN AERS
2.0000 | INHALATION_SPRAY | Freq: Once | RESPIRATORY_TRACT | Status: AC
  Administered 2016-08-30: 2 via RESPIRATORY_TRACT
  Filled 2016-08-30: qty 6.7

## 2016-08-30 MED ORDER — SODIUM CHLORIDE 0.9 % IV BOLUS (SEPSIS)
1000.0000 mL | Freq: Once | INTRAVENOUS | Status: AC
  Administered 2016-08-30: 1000 mL via INTRAVENOUS

## 2016-08-30 MED ORDER — PREDNISONE 20 MG PO TABS: ORAL_TABLET | ORAL | 0 refills | Status: DC

## 2016-08-30 NOTE — ED Provider Notes (Signed)
[facility] Provider Note   CSN: [id number] Arrival date & time: 08/30/16  1530     History   Chief Complaint Chief Complaint  Patient presents with  . Chest Pain    HPI Shane Castillo is a 41 y.o. male.  HPI   41 year old male with past medical history of smoking, alcohol abuse, marijuana use and asthma who presents to the emergency department with 2-3 days of progressively worsening left-sided chest pain associated with 2 or 3 small spots under his left axilla. Review of systems patient also states that he has pretty consistent night sweats for the last few weeks. No fevers. Some cough. Does have a history of being incarcerated in jail and in prison for ovarian length of time. Denies any other drug use. Denies any skin color changes.  Past Medical History:  Diagnosis Date  . Anxiety   . Asthma   . Panic attack     Patient Active Problem List   Diagnosis Date Noted  . Panic attacks 07/03/2016  . Tobacco abuse 07/03/2016    History reviewed. No pertinent surgical history.     Home Medications    Prior to Admission medications   Medication Sig Start Date End Date Taking? Authorizing Provider  aspirin-acetaminophen-caffeine (EXCEDRIN MIGRAINE) [phone] MG tablet Take 2 tablets by mouth once.   Yes [provider]  Aspirin-Salicylamide-Caffeine (BC HEADACHE) 325-95-16 MG TABS Take 1 packet by mouth daily as needed (for pain).   Yes [provider]  ibuprofen (ADVIL,MOTRIN) 200 MG tablet Take 800 mg by mouth every 8 (eight) hours as needed for pain.   Yes [provider]  doxycycline (VIBRAMYCIN) 100 MG capsule Take 1 capsule (100 mg total) by mouth 2 (two) times daily. One po bid x 7 days 08/30/16   Deonta Bomberger, Barbara CowerJason, MD  predniSONE (DELTASONE) 20 MG tablet 2 tabs po daily x 4 days 08/30/16   Reaghan Kawa, Barbara CowerJason, MD    Family History History reviewed. No pertinent family history.  Social History Social History  Substance Use Topics  .  Smoking status: Current Every Day Smoker    Packs/day: 0.75    Years: 20.00    Types: Cigarettes  . Smokeless tobacco: Never Used  . Alcohol use Yes     Comment: occasional     Allergies   Aspirin   Review of Systems Review of Systems  All other systems reviewed and are negative.    Physical Exam Updated Vital Signs BP 120/80   Pulse 92   Temp 98.6 F (37 C) (Oral)   Resp 18   Ht 6' (1.829 m)   Wt 99.8 kg (220 lb)   SpO2 95%   BMI 29.84 kg/m   Physical Exam  Constitutional: He appears well-developed and well-nourished.  HENT:  Head: Normocephalic and atraumatic.  Couple swollen lymph nodes in left axilla  Eyes: Conjunctivae and EOM are normal.  Neck: Normal range of motion.  Cardiovascular: Tachycardia present.   Pulmonary/Chest: Effort normal. Tachypnea noted. No respiratory distress. He has decreased breath sounds.  Abdominal: Soft. He exhibits no distension.  Musculoskeletal: Normal range of motion.  Neurological: He is alert.  Skin: Skin is warm and dry.  Nursing note and vitals reviewed.    ED Treatments / Results  Labs (all labs ordered are listed, but only abnormal results are displayed) Labs Reviewed  CBC WITH DIFFERENTIAL/PLATELET - Abnormal; Notable for the following:       Result Value   Hemoglobin 18.3 (*)  All other components within normal limits  COMPREHENSIVE METABOLIC PANEL  TROPONIN I  D-DIMER, QUANTITATIVE (NOT AT [facility])    EKG  EKG Interpretation  Date/Time:  Saturday August 30 2016 15:43:34 EDT Ventricular Rate:  103 PR Interval:    QRS Duration: 96 QT Interval:  316 QTC Calculation: 414 R Axis:   75 Text Interpretation:  Sinus tachycardia Probable left atrial enlargement Left ventricular hypertrophy ST elevation, consider anterior injury Baseline wander in lead(s) I III aVR aVL aVF Technically poor tracing needs repeat. no charge Confirmed by Marily Memos [phone]) on 08/30/2016 3:48:25 PM       Radiology Dg Chest 2  View  Result Date: 08/30/2016 CLINICAL DATA:  LEFT chest pain for 2-3 days, shortness of breath today. History of asthma, smoker. EXAM: CHEST  2 VIEW COMPARISON:  Chest radiograph October 28, 2011 FINDINGS: Cardiomediastinal silhouette is normal. No pleural effusions or focal consolidations. Mild bronchitic changes. Trachea projects midline and there is no pneumothorax. Soft tissue planes and included osseous structures are non-suspicious. IMPRESSION: Mild bronchitic changes without focal consolidation. Electronically Signed   By: Awilda Metro M.D.   On: 08/30/2016 16:34   Ct Chest W Contrast  Result Date: 08/30/2016 CLINICAL DATA:  Chest pain for 2-3 days. Sore lumps under left arm today. History of asthma. EXAM: CT CHEST WITH CONTRAST TECHNIQUE: Multidetector CT imaging of the chest was performed during intravenous contrast administration. CONTRAST:  75mL ISOVUE-300 IOPAMIDOL (ISOVUE-300) INJECTION 61% COMPARISON:  None. FINDINGS: Cardiovascular: Thoracic aorta is normal in caliber and configuration. No aneurysm or dissection. Heart size is normal. No pericardial effusion. No evidence of central obstructing pulmonary embolism. Mediastinum/Nodes: Scattered small lymph nodes within the mediastinum and perihilar regions. No masses or enlarged lymph nodes. Esophagus is unremarkable. Trachea and central bronchi are unremarkable. Lungs/Pleura: Lungs are clear.  No pleural effusion or pneumothorax. Upper Abdomen: Scattered small lymph nodes within the upper abdomen and upper retroperitoneum, none of which are pathologic by CT size criteria. Limited images of the upper abdomen are otherwise unremarkable. Musculoskeletal: No acute or suspicious osseous finding. Skin thickening near the left axilla, incompletely imaged, with mild edema in the underlying subcutaneous soft tissues, suggesting cellulitis. Similar findings on the right as well. No enlarged lymph nodes within the axillary regions. IMPRESSION: 1. No acute  intrathoracic abnormality. No pneumonia or pulmonary edema. Heart size is normal. 2. Small lymph nodes within the mediastinum and upper abdomen, slightly more numerous than typically seen but none are pathologic by CT size criteria. If clinical/laboratory findings suggest non neoplastic process, per consensus guidelines, recommend short-term follow-up CT of the chest and abdomen in 3 months to ensure stability or resolution. 3. Skin thickening within the bilateral axillary regions, with mild underlying subcutaneous edema on the left, suggesting localized cellulitis. No enlarged lymph nodes within either axilla. Electronically Signed   By: Bary Richard M.D.   On: 08/30/2016 18:24    Procedures Procedures (including critical care time)  Medications Ordered in ED Medications  ipratropium-albuterol (DUONEB) 0.5-2.5 (3) MG/3ML nebulizer solution 3 mL (3 mLs Nebulization Given 08/30/16 1614)  ketorolac (TORADOL) 30 MG/ML injection 15 mg (15 mg Intravenous Given 08/30/16 1618)  sodium chloride 0.9 % bolus 1,000 mL (0 mLs Intravenous Stopped 08/30/16 1844)  predniSONE (DELTASONE) tablet 60 mg (60 mg Oral Given 08/30/16 1750)  azithromycin (ZITHROMAX) tablet 500 mg (500 mg Oral Given 08/30/16 1750)  iopamidol (ISOVUE-300) 61 % injection 75 mL (75 mLs Intravenous Contrast Given 08/30/16 1803)  albuterol (PROVENTIL HFA;VENTOLIN HFA)  108 (90 Base) MCG/ACT inhaler 2 puff (2 puffs Inhalation Given 08/30/16 1846)     Initial Impression / Assessment and Plan / ED Course  I have reviewed the triage vital signs and the nursing notes.  Pertinent labs & imaging results that were available during my care of the patient were reviewed by me and considered in my medical decision making (see chart for details).  Will work up for PE/ACS/pneumonia. Possibly just bronchitis, will try duoneb.     Patient with multiple enlarged lymph nodes on his chest CT. His leg related to his bronchitis however does have a family history of some  type of lymph node cancer in his father at age 42. Patient will undergo a course of antibiotics and follow-up with his primary doctor in a few months for repeat CT scan or return here if worsening symptoms or not improving symptoms for further management.  Final Clinical Impressions(s) / ED Diagnoses   Final diagnoses:  Cough  Chest pain, unspecified type    New Prescriptions Discharge Medication List as of 08/30/2016  6:32 PM    START taking these medications   Details  doxycycline (VIBRAMYCIN) 100 MG capsule Take 1 capsule (100 mg total) by mouth 2 (two) times daily. One po bid x 7 days, Starting Sat 08/30/2016, Print    predniSONE (DELTASONE) 20 MG tablet 2 tabs po daily x 4 days, Print         Dallon Dacosta, Barbara CowerJason, MD 08/30/16 [phone]

## 2016-08-30 NOTE — ED Triage Notes (Signed)
Chest pain 2-3 days.  Noticed sore lumps under left arm today.

## 2016-08-30 NOTE — ED Notes (Signed)
Returned from X-Ray.

## 2016-08-30 NOTE — Discharge Instructions (Signed)
You had multiple swollen lymph nodes in your chest and axilla. This is likely related to the bronchitis/infection you are being treated for but need to be followed up with repeat CT scan in 3 months to make sure they are improving and not related to some type of cancerous process.

## 2016-09-04 ENCOUNTER — Other Ambulatory Visit (HOSPITAL_COMMUNITY): Payer: Self-pay | Admitting: Oncology

## 2016-09-04 DIAGNOSIS — R590 Localized enlarged lymph nodes: Secondary | ICD-10-CM | POA: Insufficient documentation

## 2016-09-04 NOTE — ED Provider Notes (Signed)
Called patient back and spoke with his girlfriend/emergency contact who accompanied him to the hospital. He is still having night sweats, cough, low energy, not feeling well and chest pain even after 6 days of antibiotics and feels like he is getting worse. This in concert with questionable findings on CT scan, will refer to oncology. Girlfriend and patient will work on appointment today and call if any problems. I will also send epic note to Dr. Janyth ContesZhou in reference to same.    Marily MemosMesner, Aundra Pung, MD 09/04/16 1126

## 2016-09-05 ENCOUNTER — Telehealth (HOSPITAL_COMMUNITY): Payer: Self-pay

## 2016-09-05 ENCOUNTER — Telehealth (HOSPITAL_COMMUNITY): Payer: Self-pay | Admitting: Emergency Medicine

## 2016-09-05 NOTE — Telephone Encounter (Signed)
-----   Message from Leida LauthAmy J Nance sent at 09/05/2016 10:01 AM EDT -----   ----- Message ----- From: Ralene CorkZhou, Louise, MD Sent: 09/04/2016   4:13 PM To: Amy J Nance  Hi Amy, This patient was referred by Dr. Clayborne DanaMesner in the ED for lymphadenopathy follow up. Radiology recommended he get a repeat CT C/A/P in 3 months. So can you call this patient and let him know that we will see him in 3 months time and we will do labs and CT C/A/P to make sure those lymph nodes that were seen on CT chest has resolved. Schedule him as a new patient in 3 months. Thank you!

## 2016-09-05 NOTE — Telephone Encounter (Signed)
Called and spoke with patients significant other. Explained why he needed the repeat CT in 3 months and gave appointments to her. Also mailed list of appts. S.O. verbalized understanding. She also stated that patient was not feeling any better. Encouraged her to help patient find a PCP and that he could always be seen by ER.

## 2016-09-08 NOTE — Telephone Encounter (Signed)
The purpose of trying to get this patient seen sooner was because of having B symptoms with his lymphadenopathy that was not improving with appropriate antibiotics concerning for early malignancy. My thought was that (especially with his family history) it would be more appropriate to be seen earlier rather than later. However I defer to your opinion (as this is not my area of expertise and thus why I didn't ask him to follow back up with the ER). Thank you.

## 2016-11-27 ENCOUNTER — Other Ambulatory Visit (HOSPITAL_COMMUNITY): Payer: Self-pay

## 2016-12-01 ENCOUNTER — Ambulatory Visit (HOSPITAL_COMMUNITY): Payer: Self-pay

## 2016-12-05 ENCOUNTER — Ambulatory Visit (HOSPITAL_COMMUNITY): Payer: Self-pay | Admitting: Oncology

## 2017-01-28 NOTE — Telephone Encounter (Signed)
Clearing out inbasket

## 2018-04-25 IMAGING — DX DG CHEST 2V
2 series · 2 of 2 positions shown · non-contrast
Comparison: Chest radiograph October 28, 2011

CLINICAL DATA: LEFT chest pain for 2-3 days, shortness of breath
today. History of asthma, smoker.

EXAM:
CHEST  2 VIEW

[chest pa]
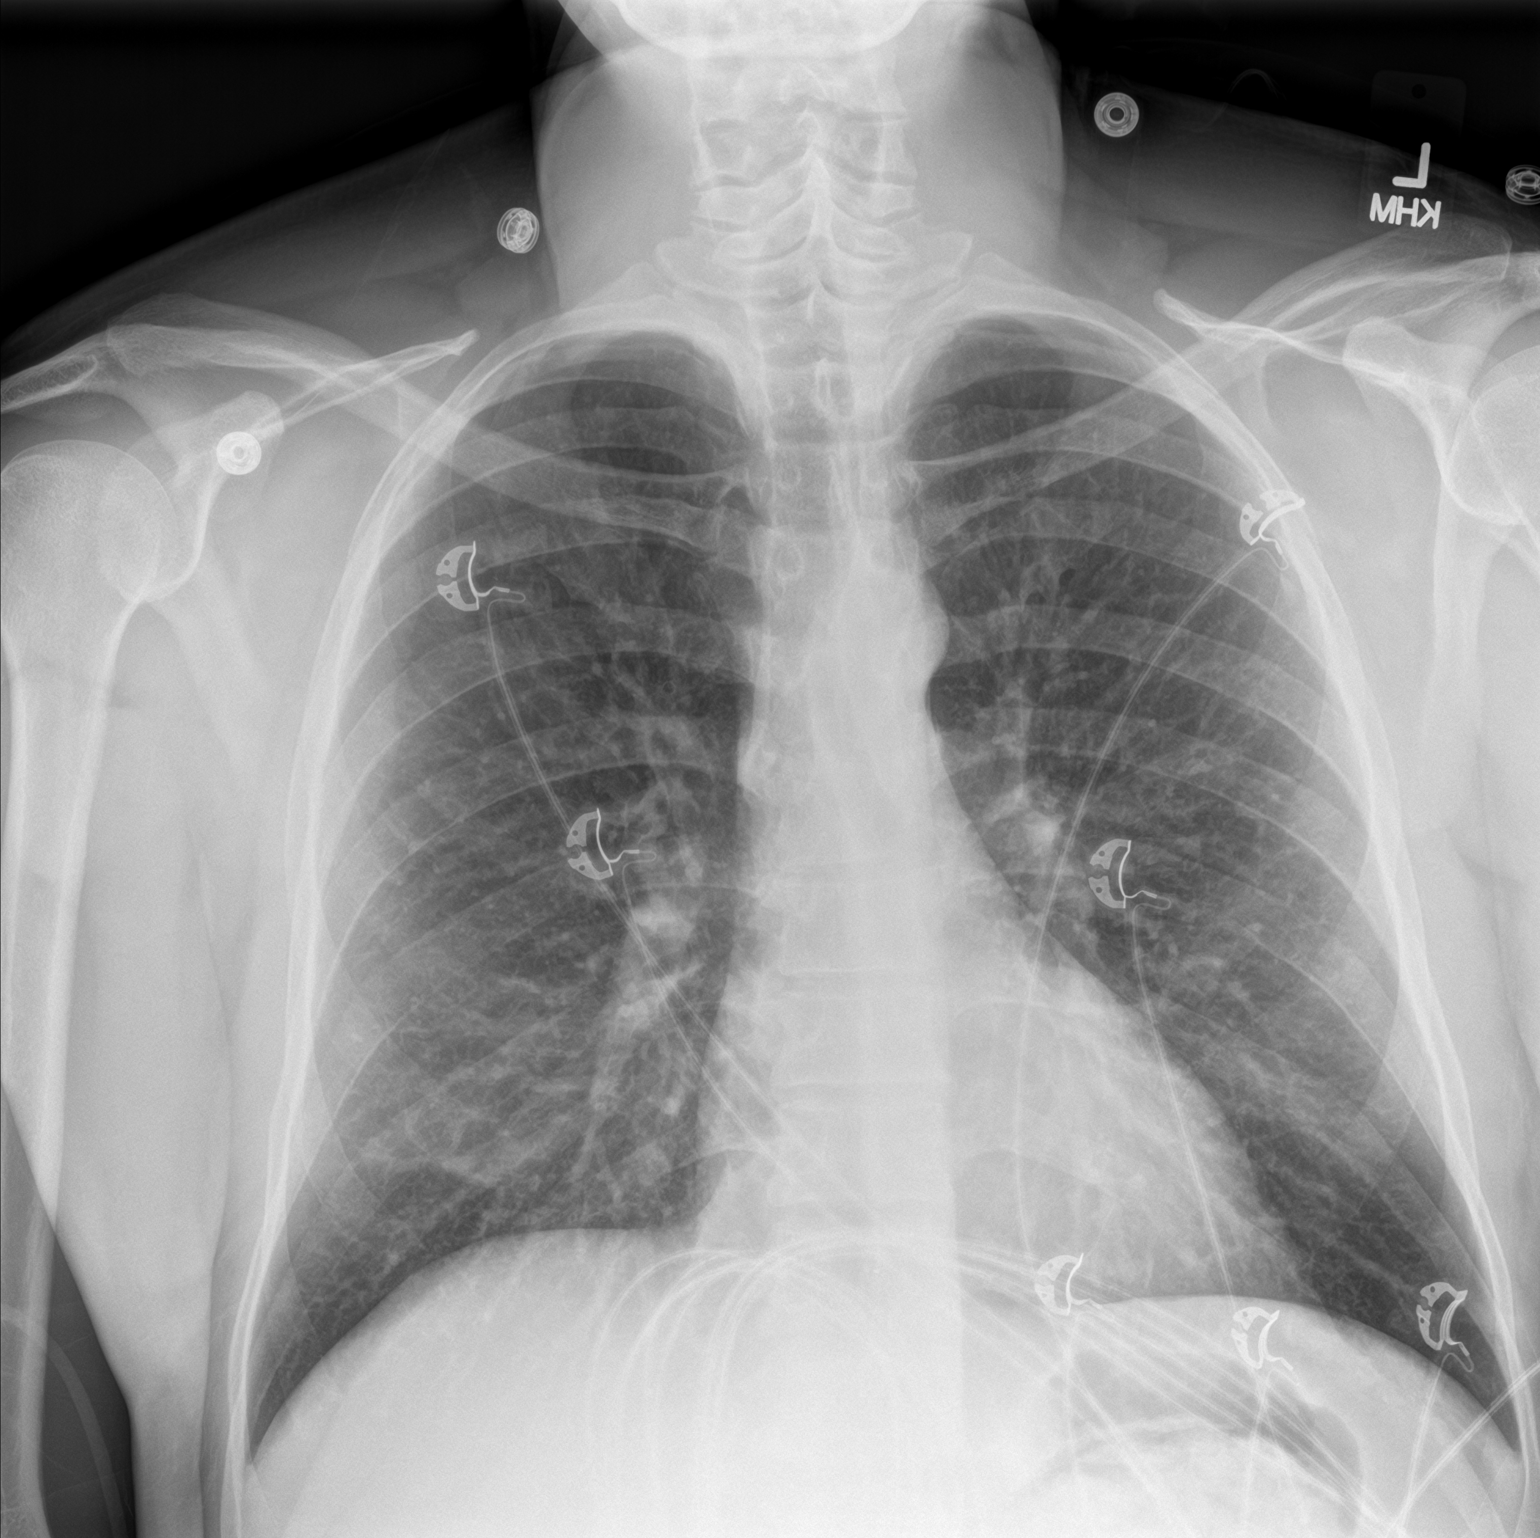

[chest lat]
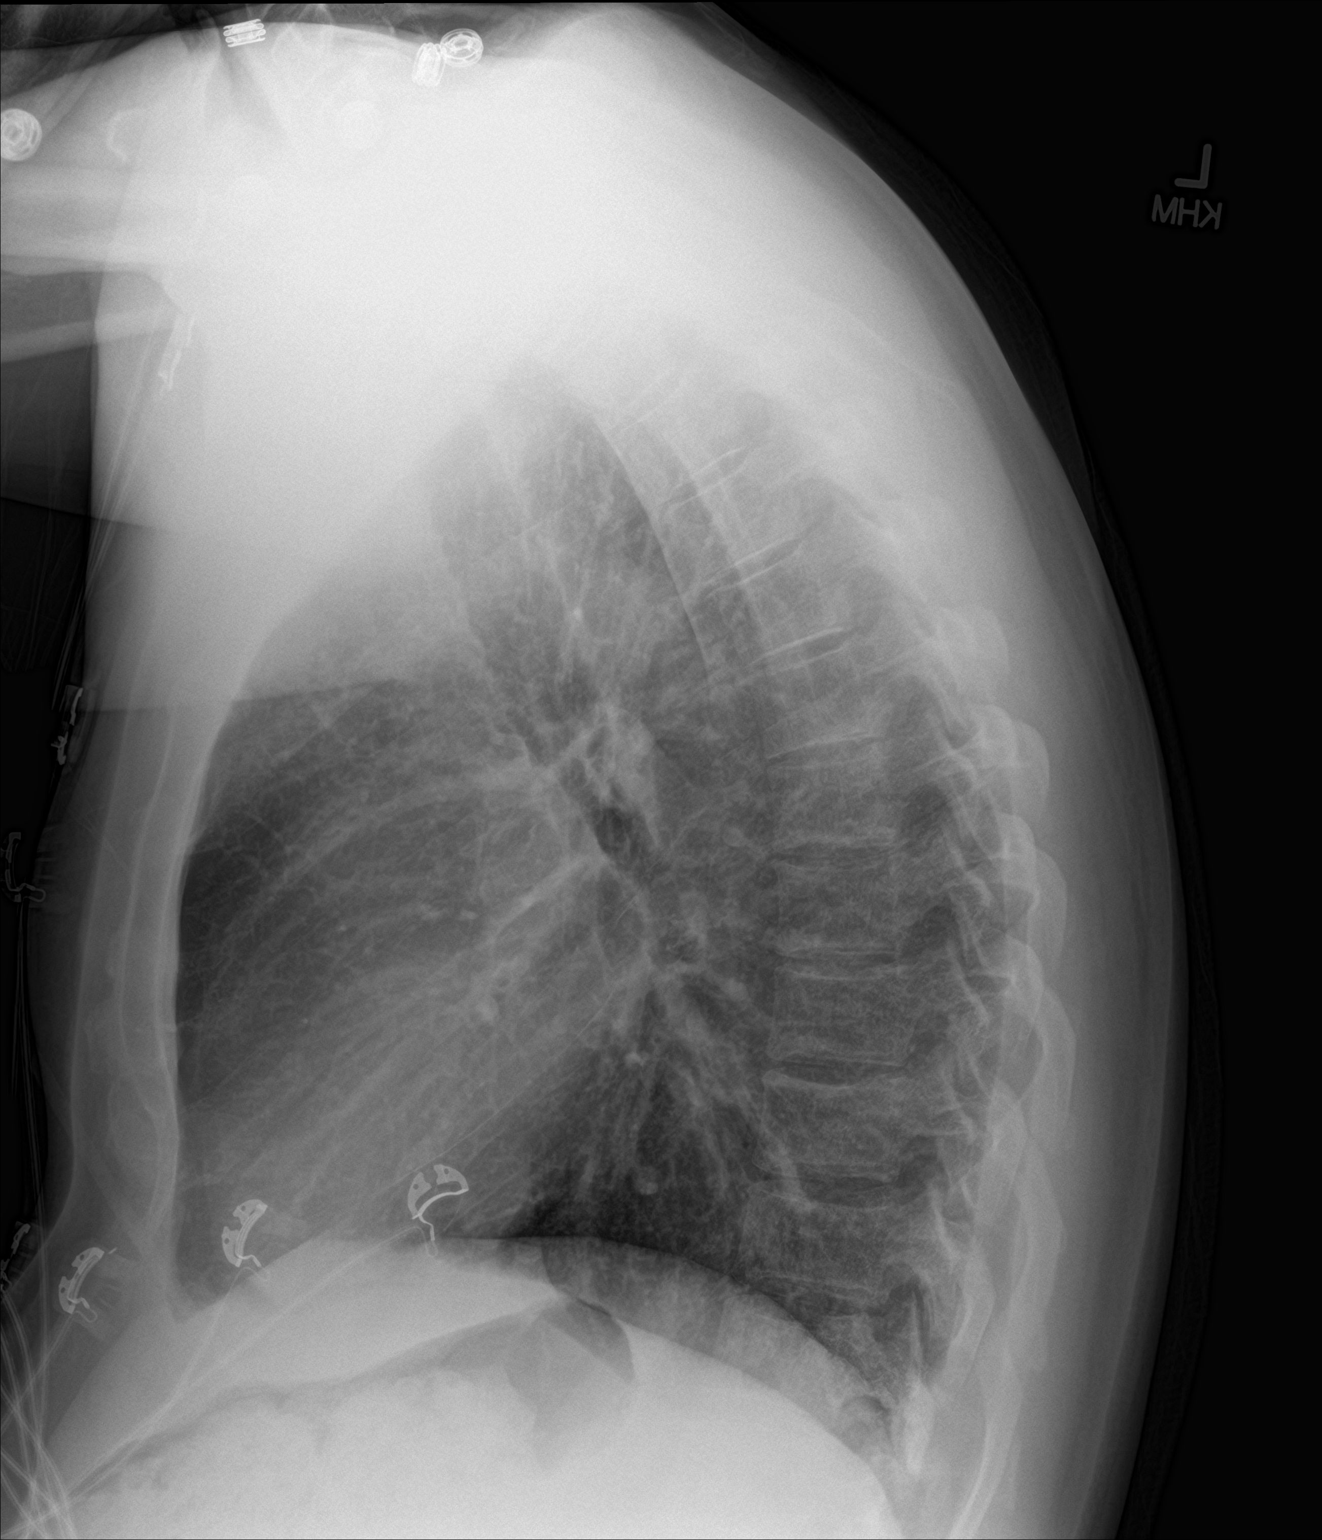

[2 of 2 positions shown; findings below may reference images not displayed]

FINDINGS: Cardiomediastinal silhouette is normal. No pleural effusions or
focal consolidations. Mild bronchitic changes. Trachea projects
midline and there is no pneumothorax. Soft tissue planes and
included osseous structures are non-suspicious.
IMPRESSION: Mild bronchitic changes without focal consolidation.

## 2018-04-25 IMAGING — CT CT CHEST W/ CM
2 of 3 series · 15 of 36 positions shown, 18 images · IV contrast (iopamidol)
Comparison: None.

CLINICAL DATA: Chest pain for 2-3 days. Sore lumps under left arm
today. History of asthma.

EXAM:
CT CHEST WITH CONTRAST
TECHNIQUE: Multidetector CT imaging of the chest was performed during
intravenous contrast administration.
CONTRAST:  75mL 2U9DHB-P44 IOPAMIDOL ([id number]) INJECTION 61%

[Series 2: axial st · axial · 0.77mm/px · z∈[+966,+1270]mm · 12 of 180 slices shown, 15 images]
[im 14/180  mediastinal]
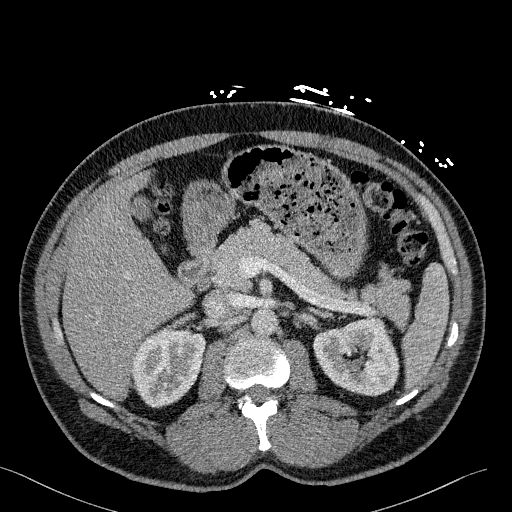
[im 14/180  lung]
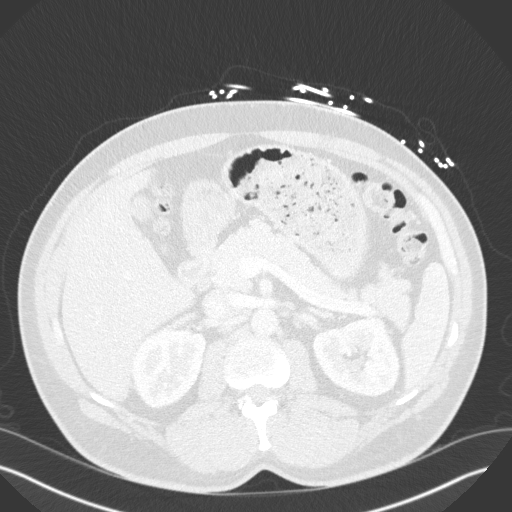
[im 27/180  lung]
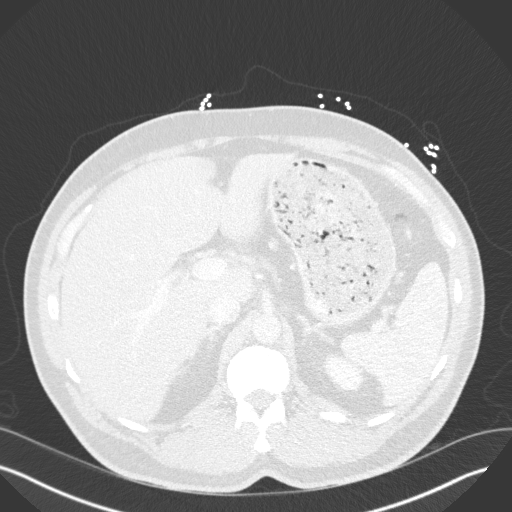
[im 40/180  lung]
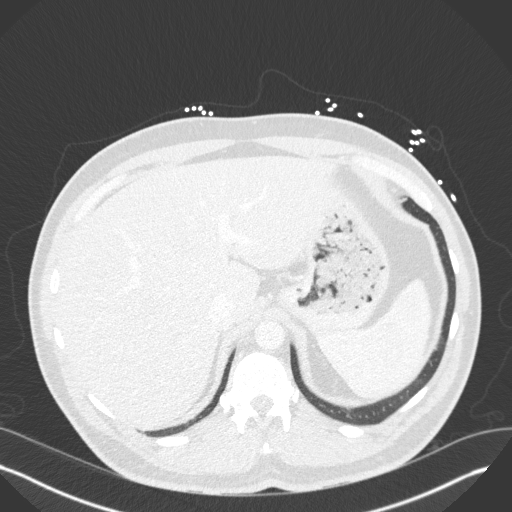
[im 54/180  lung]
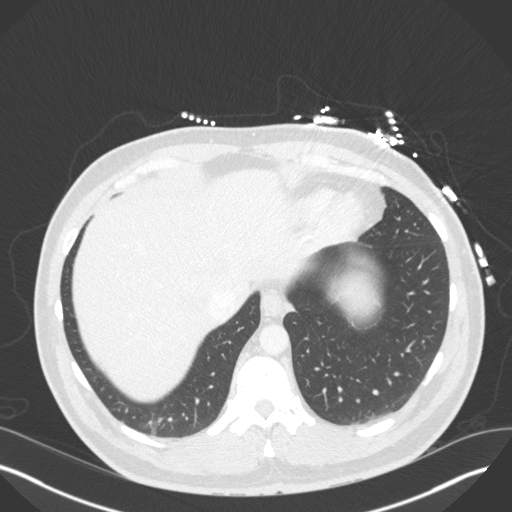
[im 67/180  mediastinal]
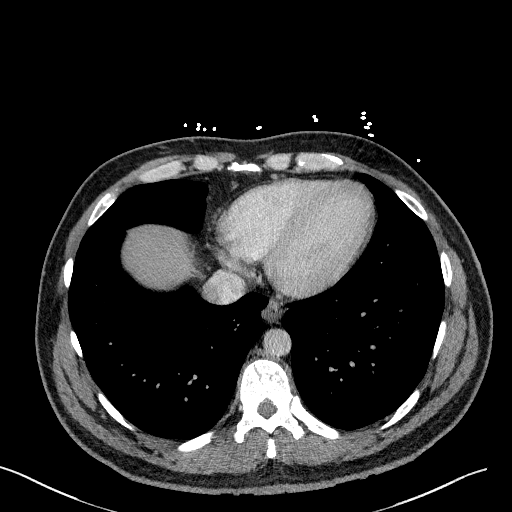
[im 67/180  lung]
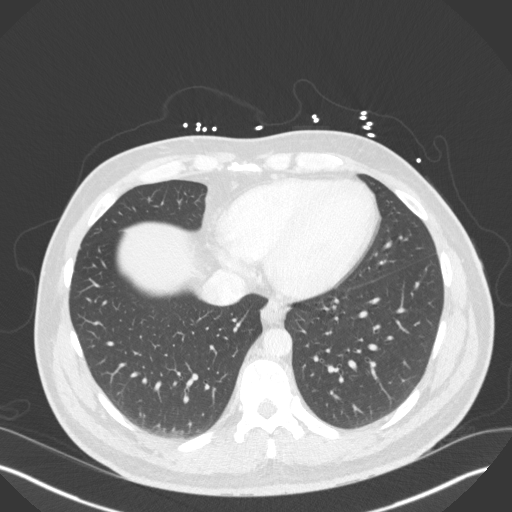
[im 80/180  lung]
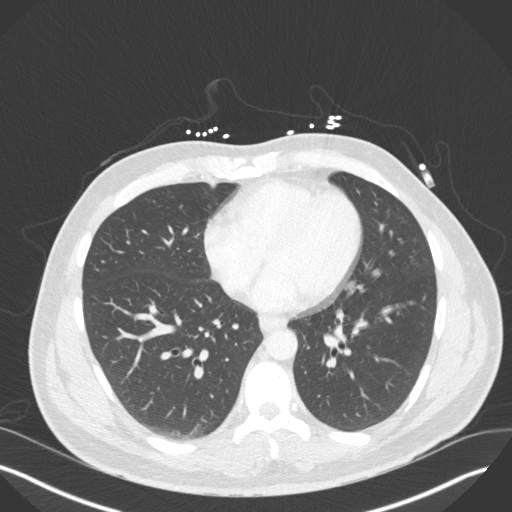
[im 100/180  lung]
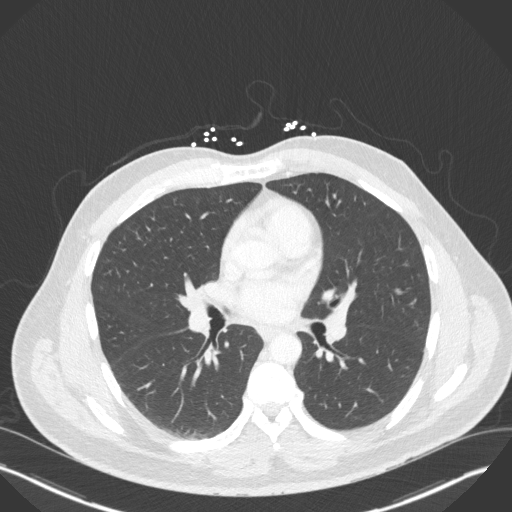
[im 113/180  lung]
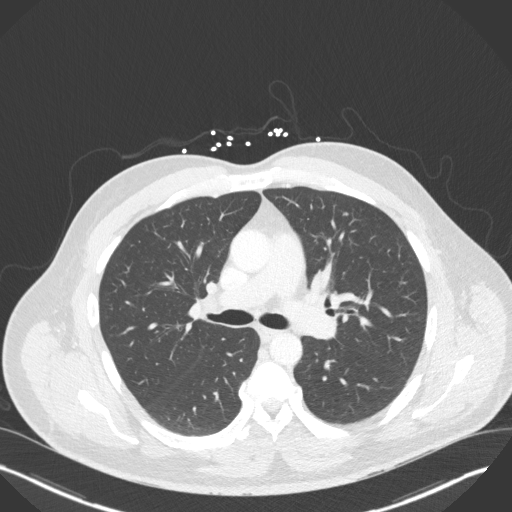
[im 126/180  mediastinal]
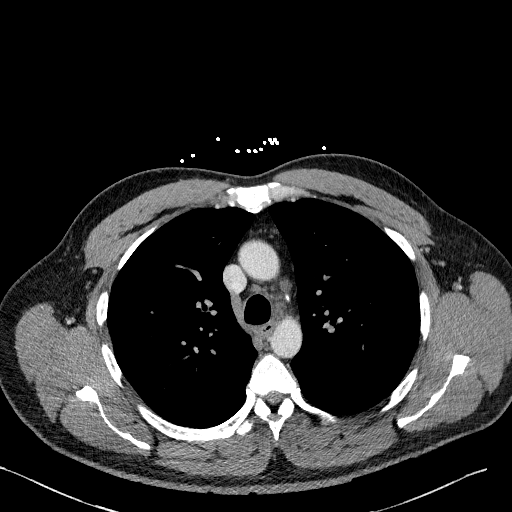
[im 126/180  lung]
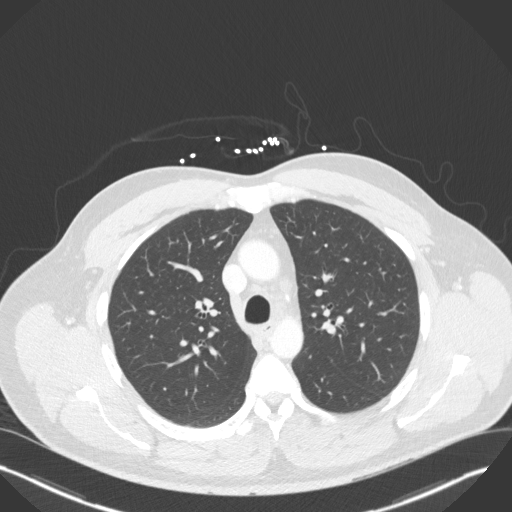
[im 140/180  lung]
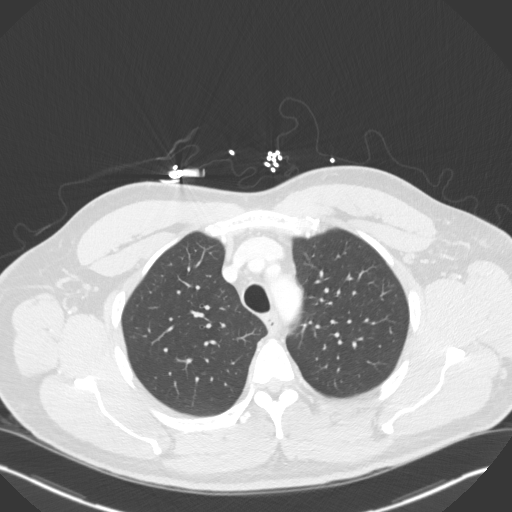
[im 153/180  lung]
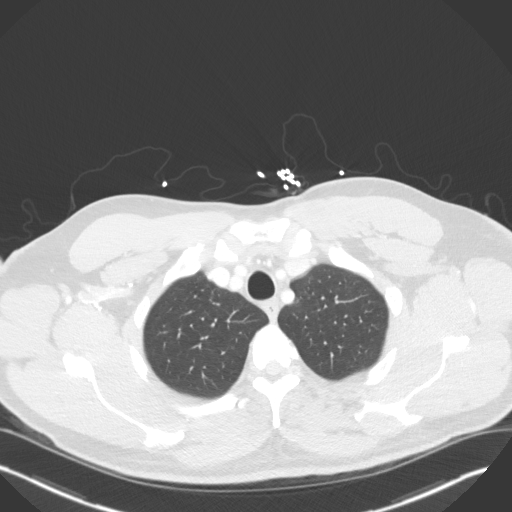
[im 166/180  lung]
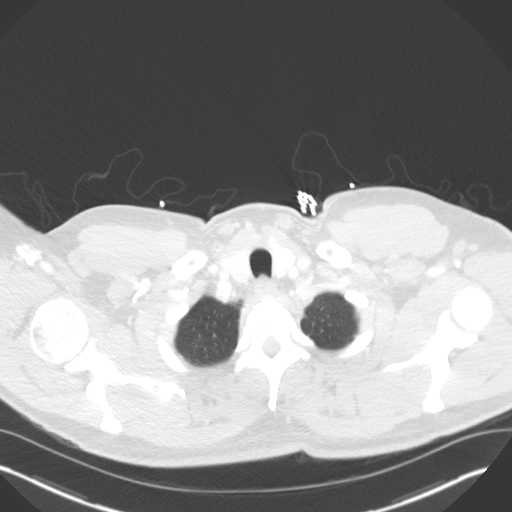

[Series 5: coronal · coronal · 0.73mm/px · 3 of 155 slices shown]
[im 31/155  lung]
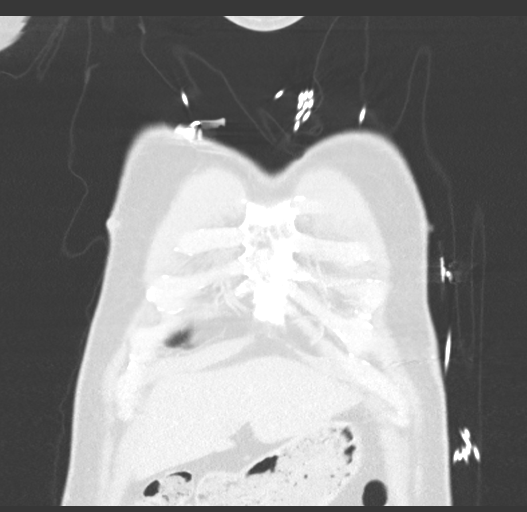
[im 62/155  lung]
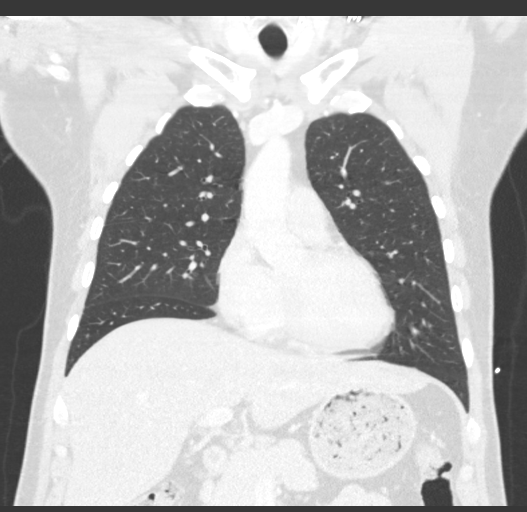
[im 93/155  lung]
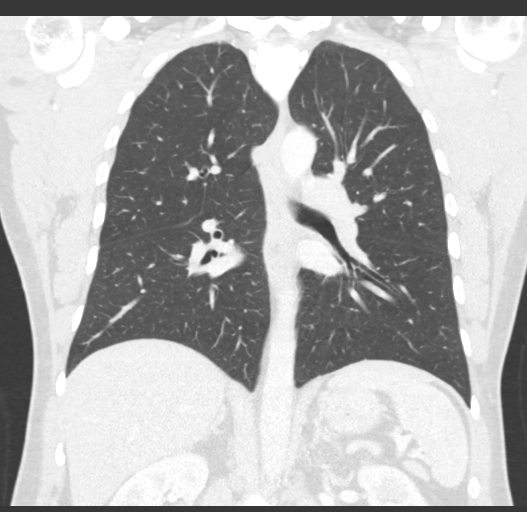

[15 of 36 positions shown; findings below may reference images not displayed]

FINDINGS: Cardiovascular: Thoracic aorta is normal in caliber and
configuration. No aneurysm or dissection. Heart size is normal. No
pericardial effusion. No evidence of central obstructing pulmonary
embolism.

Mediastinum/Nodes: Scattered small lymph nodes within the
mediastinum and perihilar regions. No masses or enlarged lymph
nodes. Esophagus is unremarkable. Trachea and central bronchi are
unremarkable.

Lungs/Pleura: Lungs are clear.  No pleural effusion or pneumothorax.

Upper Abdomen: Scattered small lymph nodes within the upper abdomen
and upper retroperitoneum, none of which are pathologic by CT size
criteria. Limited images of the upper abdomen are otherwise
unremarkable.

Musculoskeletal: No acute or suspicious osseous finding. Skin
thickening near the left axilla, incompletely imaged, with mild
edema in the underlying subcutaneous soft tissues, suggesting
cellulitis. Similar findings on the right as well. No enlarged lymph
nodes within the axillary regions.
IMPRESSION: 1. No acute intrathoracic abnormality. No pneumonia or pulmonary
edema. Heart size is normal.
2. Small lymph nodes within the mediastinum and upper abdomen,
slightly more numerous than typically seen but none are pathologic
by CT size criteria. If clinical/laboratory findings suggest non
neoplastic process, per consensus guidelines, recommend short-term
follow-up CT of the chest and abdomen in 3 months to ensure
stability or resolution.
3. Skin thickening within the bilateral axillary regions, with mild
underlying subcutaneous edema on the left, suggesting localized
cellulitis. No enlarged lymph nodes within either axilla.

## 2018-09-02 ENCOUNTER — Other Ambulatory Visit: Payer: Self-pay

## 2018-09-02 DIAGNOSIS — Z20822 Contact with and (suspected) exposure to covid-19: Secondary | ICD-10-CM

## 2018-09-03 LAB — NOVEL CORONAVIRUS, NAA: SARS-CoV-2, NAA: NOT DETECTED

## 2018-09-07 ENCOUNTER — Other Ambulatory Visit: Payer: Self-pay | Admitting: *Deleted

## 2018-09-07 ENCOUNTER — Telehealth: Payer: Self-pay

## 2018-09-07 DIAGNOSIS — Z20822 Contact with and (suspected) exposure to covid-19: Secondary | ICD-10-CM

## 2018-09-07 NOTE — Telephone Encounter (Signed)
Pt. Called back. Given COVID 19 test results.

## 2018-09-09 LAB — NOVEL CORONAVIRUS, NAA: SARS-CoV-2, NAA: NOT DETECTED

## 2019-08-07 ENCOUNTER — Other Ambulatory Visit: Payer: Self-pay

## 2019-08-07 ENCOUNTER — Emergency Department (HOSPITAL_COMMUNITY)
Admission: EM | Admit: 2019-08-07 | Discharge: 2019-08-07 | Disposition: A | Discharge status: Home / Self Care | Payer: Self-pay | Attending: Emergency Medicine | Admitting: Emergency Medicine

## 2019-08-07 ENCOUNTER — Encounter (HOSPITAL_COMMUNITY): Payer: Self-pay | Admitting: *Deleted

## 2019-08-07 DIAGNOSIS — Y93H2 Activity, gardening and landscaping: Secondary | ICD-10-CM | POA: Insufficient documentation

## 2019-08-07 DIAGNOSIS — X58XXXA Exposure to other specified factors, initial encounter: Secondary | ICD-10-CM | POA: Insufficient documentation

## 2019-08-07 DIAGNOSIS — Y9269 Other specified industrial and construction area as the place of occurrence of the external cause: Secondary | ICD-10-CM | POA: Insufficient documentation

## 2019-08-07 DIAGNOSIS — F17210 Nicotine dependence, cigarettes, uncomplicated: Secondary | ICD-10-CM | POA: Insufficient documentation

## 2019-08-07 DIAGNOSIS — S39012A Strain of muscle, fascia and tendon of lower back, initial encounter: Secondary | ICD-10-CM | POA: Insufficient documentation

## 2019-08-07 DIAGNOSIS — Z7982 Long term (current) use of aspirin: Secondary | ICD-10-CM | POA: Insufficient documentation

## 2019-08-07 DIAGNOSIS — Y999 Unspecified external cause status: Secondary | ICD-10-CM | POA: Insufficient documentation

## 2019-08-07 DIAGNOSIS — J45909 Unspecified asthma, uncomplicated: Secondary | ICD-10-CM | POA: Insufficient documentation

## 2019-08-07 LAB — COMPREHENSIVE METABOLIC PANEL
ALT: 26 U/L (ref 0–44)
AST: 26 U/L (ref 15–41)
Albumin: 4.2 g/dL (ref 3.5–5.0)
Alkaline Phosphatase: 94 U/L (ref 38–126)
Anion gap: 11 (ref 5–15)
BUN: 15 mg/dL (ref 6–20)
CO2: 27 mmol/L (ref 22–32)
Calcium: 9.2 mg/dL (ref 8.9–10.3)
Chloride: 99 mmol/L (ref 98–111)
Creatinine, Ser: 0.79 mg/dL (ref 0.61–1.24)
GFR calc Af Amer: >60 mL/min (ref >60)
GFR calc non Af Amer: >60 mL/min (ref >60)
Glucose, Bld: 111 mg/dL — ABNORMAL HIGH (ref 70–99)
Potassium: 3.9 mmol/L (ref 3.5–5.1)
Sodium: 137 mmol/L (ref 135–145)
Total Bilirubin: 0.9 mg/dL (ref 0.3–1.2)
Total Protein: 7.9 g/dL (ref 6.5–8.1)

## 2019-08-07 LAB — CBC WITH DIFFERENTIAL/PLATELET
Abs Immature Granulocytes: 0.01 10*3/uL (ref 0.00–0.07)
Basophils Absolute: 0 10*3/uL (ref 0.0–0.1)
Basophils Relative: 0 %
Eosinophils Absolute: 0.7 10*3/uL — ABNORMAL HIGH (ref 0.0–0.5)
Eosinophils Relative: 9 %
HCT: 51.8 % (ref 39.0–52.0)
Hemoglobin: 17.9 g/dL — ABNORMAL HIGH (ref 13.0–17.0)
Immature Granulocytes: 0 %
Lymphocytes Relative: 15 %
Lymphs Abs: 1.1 10*3/uL (ref 0.7–4.0)
MCH: 31.3 pg (ref 26.0–34.0)
MCHC: 34.6 g/dL (ref 30.0–36.0)
MCV: 90.6 fL (ref 80.0–100.0)
Monocytes Absolute: 0.6 10*3/uL (ref 0.1–1.0)
Monocytes Relative: 8 %
Neutro Abs: 4.9 10*3/uL (ref 1.7–7.7)
Neutrophils Relative %: 68 %
Platelets: 258 10*3/uL (ref 150–400)
RBC: 5.72 MIL/uL (ref 4.22–5.81)
RDW: 12.8 % (ref 11.5–15.5)
WBC: 7.4 10*3/uL (ref 4.0–10.5)
nRBC: 0 % (ref 0.0–0.2)

## 2019-08-07 LAB — URINALYSIS, ROUTINE W REFLEX MICROSCOPIC
Bacteria, UA: NONE SEEN
Bilirubin Urine: NEGATIVE
Glucose, UA: NEGATIVE mg/dL
Hgb urine dipstick: NEGATIVE
Ketones, ur: NEGATIVE mg/dL
Nitrite: NEGATIVE
Protein, ur: NEGATIVE mg/dL
Specific Gravity, Urine: 1.011 (ref 1.005–1.030)
pH: 7 (ref 5.0–8.0)

## 2019-08-07 MED ORDER — MORPHINE SULFATE (PF) 4 MG/ML IV SOLN
4.0000 mg | Freq: Once | INTRAVENOUS | Status: AC
  Administered 2019-08-07: 4 mg via INTRAVENOUS
  Filled 2019-08-07: qty 1

## 2019-08-07 MED ORDER — DEXAMETHASONE SODIUM PHOSPHATE 10 MG/ML IJ SOLN
10.0000 mg | Freq: Once | INTRAMUSCULAR | Status: AC
  Administered 2019-08-07: 10 mg via INTRAVENOUS
  Filled 2019-08-07: qty 1

## 2019-08-07 MED ORDER — ONDANSETRON HCL 4 MG/2ML IJ SOLN
4.0000 mg | Freq: Once | INTRAMUSCULAR | Status: AC
  Administered 2019-08-07: 4 mg via INTRAVENOUS
  Filled 2019-08-07: qty 2

## 2019-08-07 MED ORDER — HYDROCODONE-ACETAMINOPHEN 5-325 MG PO TABS: 1.0000 | ORAL_TABLET | ORAL | 0 refills | Status: DC | PRN

## 2019-08-07 MED ORDER — IBUPROFEN 200 MG PO TABS: 800.0000 mg | ORAL_TABLET | Freq: Three times a day (TID) | ORAL | 0 refills | Status: AC | PRN

## 2019-08-07 MED ORDER — PREDNISONE 10 MG (21) PO TBPK: ORAL_TABLET | ORAL | 0 refills | Status: DC

## 2019-08-07 MED ORDER — SODIUM CHLORIDE 0.9 % IV BOLUS
1000.0000 mL | Freq: Once | INTRAVENOUS | Status: AC
Start: 2019-08-07 — End: 2019-08-07
  Administered 2019-08-07: 1000 mL via INTRAVENOUS

## 2019-08-07 MED ORDER — LIDOCAINE 5 % EX PTCH
1.0000 | MEDICATED_PATCH | CUTANEOUS | Status: DC
  Administered 2019-08-07: 1 via TRANSDERMAL
  Filled 2019-08-07: qty 1
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
## 2019-08-07 NOTE — ED Notes (Signed)
Patient has father on way to pick up this patient to go home after discharge.

## 2020-02-17 ENCOUNTER — Other Ambulatory Visit: Payer: Self-pay

## 2020-02-17 ENCOUNTER — Encounter (HOSPITAL_COMMUNITY): Payer: Self-pay

## 2020-02-17 ENCOUNTER — Emergency Department (HOSPITAL_COMMUNITY)
Admission: EM | Admit: 2020-02-17 | Discharge: 2020-02-17 | Disposition: A | Discharge status: Home / Self Care | Payer: Self-pay | Attending: Emergency Medicine | Admitting: Emergency Medicine

## 2020-02-17 ENCOUNTER — Emergency Department (HOSPITAL_COMMUNITY): Payer: Self-pay

## 2020-02-17 DIAGNOSIS — W19XXXA Unspecified fall, initial encounter: Secondary | ICD-10-CM

## 2020-02-17 DIAGNOSIS — R202 Paresthesia of skin: Secondary | ICD-10-CM | POA: Insufficient documentation

## 2020-02-17 DIAGNOSIS — M25511 Pain in right shoulder: Secondary | ICD-10-CM | POA: Insufficient documentation

## 2020-02-17 DIAGNOSIS — F17210 Nicotine dependence, cigarettes, uncomplicated: Secondary | ICD-10-CM | POA: Insufficient documentation

## 2020-02-17 DIAGNOSIS — M25551 Pain in right hip: Secondary | ICD-10-CM | POA: Insufficient documentation

## 2020-02-17 DIAGNOSIS — W108XXA Fall (on) (from) other stairs and steps, initial encounter: Secondary | ICD-10-CM | POA: Insufficient documentation

## 2020-02-17 DIAGNOSIS — M545 Low back pain, unspecified: Secondary | ICD-10-CM | POA: Insufficient documentation

## 2020-02-17 DIAGNOSIS — J45909 Unspecified asthma, uncomplicated: Secondary | ICD-10-CM | POA: Insufficient documentation

## 2020-02-17 DIAGNOSIS — M542 Cervicalgia: Secondary | ICD-10-CM | POA: Insufficient documentation

## 2020-02-17 DIAGNOSIS — R0789 Other chest pain: Secondary | ICD-10-CM | POA: Insufficient documentation

## 2020-02-17 DIAGNOSIS — Z7982 Long term (current) use of aspirin: Secondary | ICD-10-CM | POA: Insufficient documentation

## 2020-02-17 MED ORDER — TRAMADOL HCL 50 MG PO TABS: 50.0000 mg | ORAL_TABLET | Freq: Four times a day (QID) | ORAL | 0 refills | Status: AC | PRN

## 2020-02-17 NOTE — ED Provider Notes (Signed)
[facility] EMERGENCY DEPARTMENT Provider Note   CSN: [id number] Arrival date & time: 02/17/20  9983     History Chief Complaint  Patient presents with  . Fall    Shane Castillo is a 45 y.o. male.  HPI      Shane Castillo is a 45 y.o. male who presents to the Emergency Department complaining of neck, right shoulder, right chest wall pain and right hip pain secondary to a mechanical fall that occurred this morning.  He states that he fell down approximately 3 steps earlier this morning.  He states that he landed on his right shoulder and right side.  He complains of pain along the midline and right side of his neck that worsens with movement.  He describes intermittent tingling to both upper extremities.  He denies head injury or loss of consciousness.  No headache or dizziness.  No visual changes.  No nausea or vomiting.  He describes having chronic lower back pain and had an MRI of his lumbar spine in December.  He denies any worsening low back pain, but does have some pain to his right hip since his fall this morning.  He denies abdominal pain, saddle anesthesias, urine or bowel changes, numbness or weakness of his lower extremities.    Past Medical History:  Diagnosis Date  . Anxiety   . Asthma   . Panic attack     Patient Active Problem List   Diagnosis Date Noted  . Mediastinal lymphadenopathy 09/04/2016  . Panic attacks 07/03/2016  . Tobacco abuse 07/03/2016    History reviewed. No pertinent surgical history.     No family history on file.  Social History   Tobacco Use  . Smoking status: Current Every Day Smoker    Packs/day: 0.75    Years: 20.00    Pack years: 15.00    Types: Cigarettes  . Smokeless tobacco: Never Used  Substance Use Topics  . Alcohol use: Yes    Comment: occasional  . Drug use: No    Home Medications Prior to Admission medications   Medication Sig Start Date End Date Taking? Authorizing Provider  aspirin-acetaminophen-caffeine  (EXCEDRIN MIGRAINE) [phone] MG tablet Take 2 tablets by mouth once.    [provider]  Aspirin-Salicylamide-Caffeine (BC HEADACHE) [phone] MG TABS Take 1 packet by mouth daily as needed (for pain).    [provider]  doxycycline (VIBRAMYCIN) 100 MG capsule Take 1 capsule (100 mg total) by mouth 2 (two) times daily. One po bid x 7 days 08/30/16   Mesner, Barbara Cower, MD  HYDROcodone-acetaminophen (NORCO/VICODIN) 5-325 MG tablet Take 1 tablet by mouth every 4 (four) hours as needed. 08/07/19   Jacalyn Lefevre, MD  ibuprofen (ADVIL) 200 MG tablet Take 4 tablets (800 mg total) by mouth every 8 (eight) hours as needed. 08/07/19   Jacalyn Lefevre, MD  predniSONE (STERAPRED UNI-PAK 21 TAB) 10 MG (21) TBPK tablet Take 6 tabs for 2 days, then 5 for 2 days, then 4 for 2 days, then 3 for 2 days, 2 for 2 days, then 1 for 2 days 08/07/19   Jacalyn Lefevre, MD    Allergies    Aspirin  Review of Systems   Review of Systems  Constitutional: Negative for chills and fever.  Eyes: Negative for visual disturbance.  Respiratory: Negative for chest tightness and shortness of breath.   Cardiovascular: Positive for chest pain (right chest wall pain).  Gastrointestinal: Negative for abdominal pain, nausea and vomiting.  Genitourinary: Negative for  difficulty urinating, dysuria and hematuria.  Musculoskeletal: Positive for arthralgias (Right shoulder, right hip pain), joint swelling and neck pain.  Skin: Negative for color change and wound.  Neurological: Negative for dizziness, syncope, weakness, light-headedness, numbness and headaches.  Psychiatric/Behavioral: Negative for confusion.    Physical Exam Updated Vital Signs BP 117/78 (BP Location: Right Arm)   Pulse (!) 130   Temp 98.5 F (36.9 C) (Oral)   Resp 18   Ht 6' (1.829 m)   Wt 99.8 kg   SpO2 91%   BMI 29.84 kg/m   Physical Exam Constitutional:      General: He is not in acute distress.    Appearance: Normal appearance. He is not  ill-appearing.  HENT:     Head: Atraumatic.     Comments: No scalp abrasions or hematomas.    Mouth/Throat:     Mouth: Mucous membranes are moist.  Eyes:     Extraocular Movements: Extraocular movements intact.     Conjunctiva/sclera: Conjunctivae normal.     Pupils: Pupils are equal, round, and reactive to light.  Neck:     Comments: Tender to palpation along the mid cervical spine and right cervical paraspinal muscles.  No bony step-offs.  C-collar applied. Cardiovascular:     Rate and Rhythm: Normal rate and regular rhythm.     Pulses: Normal pulses.  Pulmonary:     Effort: Pulmonary effort is normal.     Breath sounds: Normal breath sounds.  Chest:     Chest wall: Tenderness (Mild tenderness to palpation of the lateral right chest wall.  No crepitus or ecchymosis.) present.  Abdominal:     General: There is no distension.     Palpations: Abdomen is soft.     Tenderness: There is no abdominal tenderness.  Musculoskeletal:        General: Tenderness and signs of injury present.     Cervical back: Tenderness present.     Comments: Mild tenderness with range of motion of the right hip.  No extremity shortening.  Pain with abduction of the right shoulder.  No bony step-offs.  Grip strength is strong and symmetrical bilaterally.  Skin:    General: Skin is warm.     Capillary Refill: Capillary refill takes less than 2 seconds.     Findings: No bruising or erythema.  Neurological:     General: No focal deficit present.     Mental Status: He is alert.     Sensory: No sensory deficit.     Motor: No weakness.     Coordination: Coordination normal.     ED Results / Procedures / Treatments   Labs (all labs ordered are listed, but only abnormal results are displayed) Labs Reviewed - No data to display  EKG None  Radiology DG Pelvis 1-2 Views  Result Date: 02/17/2020 CLINICAL DATA:  Back pain after fall. EXAM: PELVIS - 1-2 VIEW COMPARISON:  None. FINDINGS: There is no  evidence of pelvic fracture or diastasis. No pelvic bone lesions are seen. IMPRESSION: Negative. Electronically Signed   By: Lupita Raider M.D.   On: 02/17/2020 10:19   DG Shoulder Right  Result Date: 02/17/2020 CLINICAL DATA:  Right shoulder pain after fall. EXAM: RIGHT SHOULDER - 2+ VIEW COMPARISON:  January 03, 2020. FINDINGS: There is no evidence of fracture or dislocation. There is no evidence of arthropathy or other focal bone abnormality. Soft tissues are unremarkable. IMPRESSION: Negative. Electronically Signed   By: Zenda Alpers.D.  On: 02/17/2020 10:19   CT Cervical Spine Wo Contrast  Result Date: 02/17/2020 CLINICAL DATA:  Neck trauma.  Midline tenderness. EXAM: CT CERVICAL SPINE WITHOUT CONTRAST TECHNIQUE: Multidetector CT imaging of the cervical spine was performed without intravenous contrast. Multiplanar CT image reconstructions were also generated. COMPARISON:  None. FINDINGS: Alignment: Normal Skull base and vertebrae: No acute fracture. No primary bone lesion or focal pathologic process. Soft tissues and spinal canal: No prevertebral fluid or swelling. No visible canal hematoma. Disc levels:  Disc spaces are well preserved. Upper chest: Negative. Other: None. IMPRESSION: No evidence for cervical spine fracture. Electronically Signed   By: Signa Kell M.D.   On: 02/17/2020 10:06    Procedures Procedures (including critical care time)  Medications Ordered in ED Medications - No data to display  ED Course  I have reviewed the triage vital signs and the nursing notes.  Pertinent labs & imaging results that were available during my care of the patient were reviewed by me and considered in my medical decision making (see chart for details).    MDM Rules/Calculators/A&P                          Patient here for evaluation after a mechanical fall down several steps.  Injury occurred this morning.  No head injury or loss of consciousness.  Patient does complain of mid to  right neck pain.  He is neurovascularly intact.  C-collar applied at in triage.   1045 C-collar removed by me after review of imaging.  Patient resting comfortably, no focal neuro deficits, ambulates with a steady gait.  Injuries likely musculoskeletal.  I feel that he is appropriate for d/c home.  He has muscle relaxer at home and NSAID. Agrees to symptomatic treatment and out pt f/u if needed.  Pt requesting neurosurgery referral for his prior low back pain.    Final Clinical Impression(s) / ED Diagnoses Final diagnoses:  Fall, initial encounter    Rx / DC Orders ED Discharge Orders    None       Pauline Aus, PA-C 02/18/20 1004    Horton, Clabe Seal, DO 02/21/20 1344

## 2020-02-17 NOTE — Discharge Instructions (Addendum)
Your x-rays today did not show any evidence of any broken bones.  I recommend that you follow-up with your primary care provider or with the neurosurgery provider listed if your pain is not improving.

## 2020-02-17 NOTE — ED Triage Notes (Signed)
Pt presents to ED following fall down steps. Pt c/o right side lower back pain, right shoulder and neck pain. Pt denies LOC or hitting head

## 2020-02-23 ENCOUNTER — Encounter: Payer: Self-pay | Admitting: Orthopedic Surgery

## 2020-02-23 DIAGNOSIS — M549 Dorsalgia, unspecified: Secondary | ICD-10-CM

## 2020-02-23 DIAGNOSIS — G8929 Other chronic pain: Secondary | ICD-10-CM

## 2020-02-23 NOTE — Telephone Encounter (Signed)
This encounter was created in error - please disregard.

## 2020-02-23 NOTE — Progress Notes (Signed)
Erroneous encounter

## 2021-10-12 IMAGING — DX DG PELVIS 1-2V
1 series · 1 of 1 positions shown · non-contrast
Comparison: None.

CLINICAL DATA: Back pain after fall.

EXAM:
PELVIS - 1-2 VIEW

[pelvis ap]
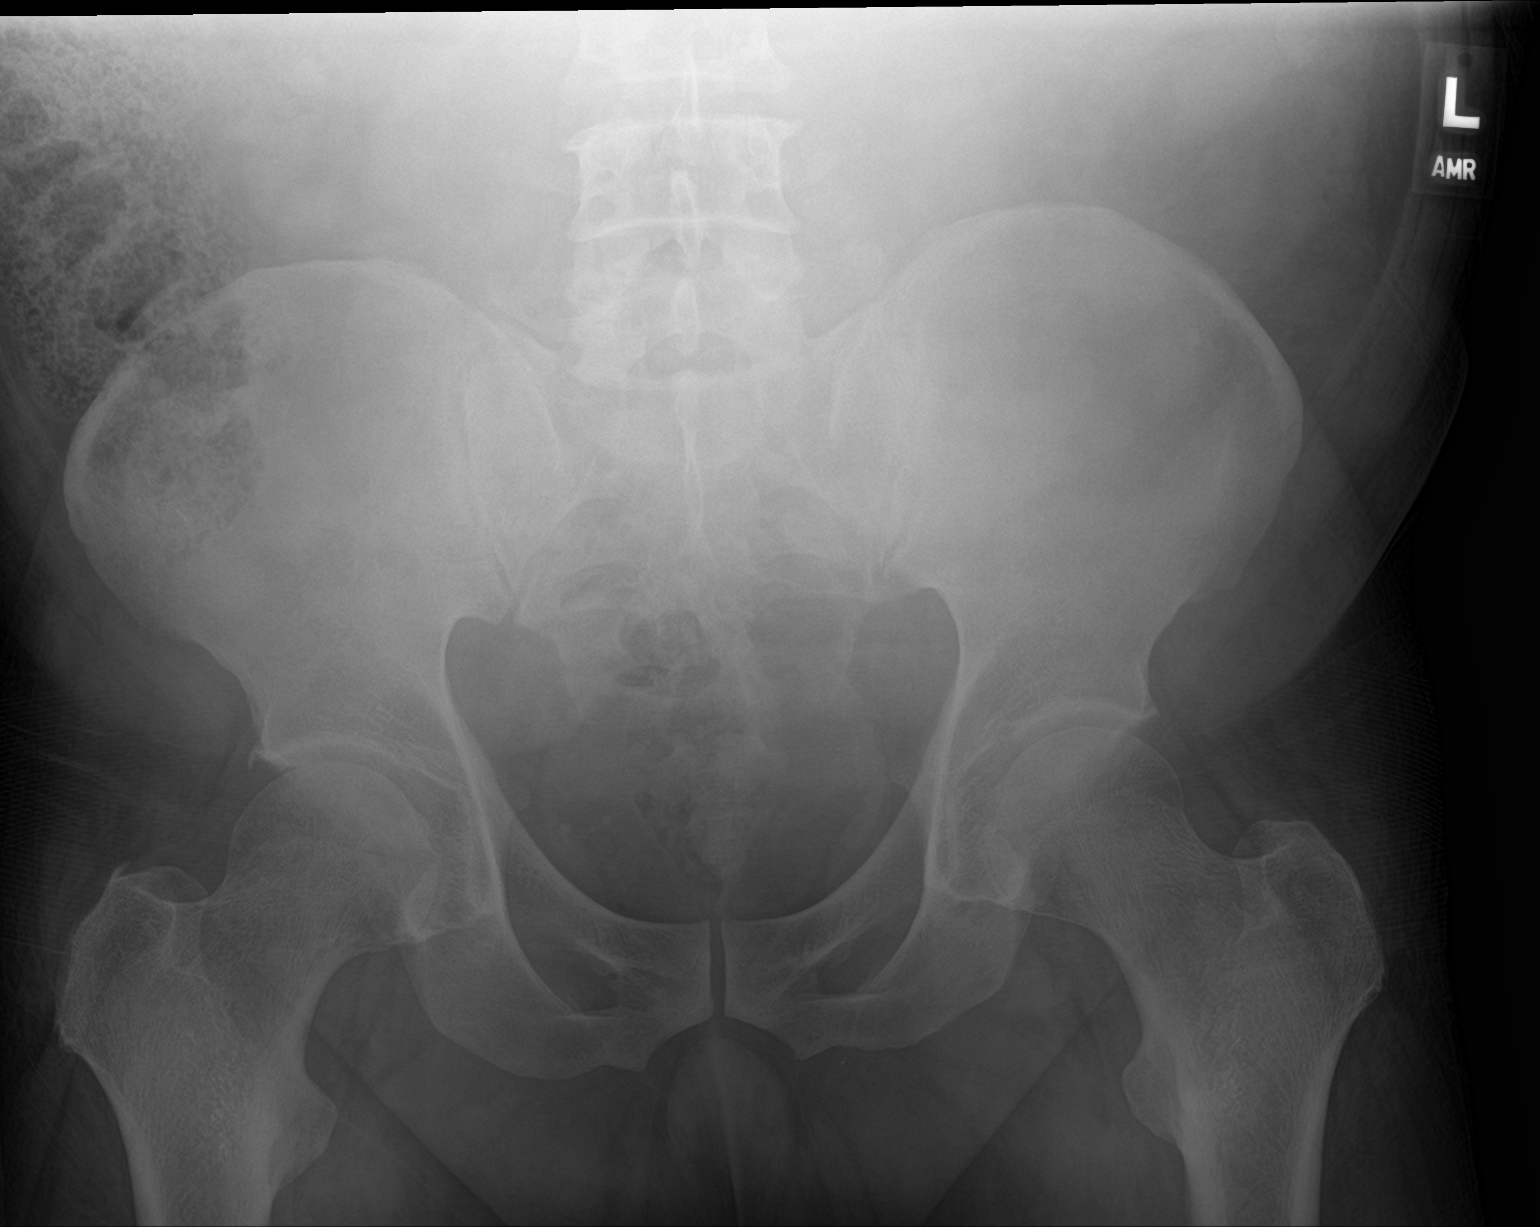

[1 of 1 positions shown; findings below may reference images not displayed]

FINDINGS: There is no evidence of pelvic fracture or diastasis. No pelvic bone
lesions are seen.
IMPRESSION: Negative.

## 2023-07-24 NOTE — ED Provider Notes (Signed)
 Emergency Department Provider Note    ED Clinical Impression   Final diagnoses:  Sciatica of right side (Primary)  Lumbar strain, initial encounter    ED Assessment/Plan    History   Chief Complaint  Patient presents with  . Back Pain   This 48 year old male slipped on a road in his house twisting his back this morning.  He now complains of increasing tightness and aching pain in the right back radiating to the right leg. No bowel or bladder problems  On chart review the patient was found to have L5-S1 disc narrowing and mild degenerative disease on x-ray done in 2022.    Past Medical History[1]  Past Surgical History[2]  Family History[3]  Social History[4]  Review of Systems  Constitutional:  Negative for fever.  Cardiovascular:  Negative for chest pain.  Gastrointestinal:  Negative for abdominal pain.  Genitourinary:  Negative for difficulty urinating.  Neurological:  Negative for numbness.    Physical Exam   BP 131/85   Pulse 108   Temp 36.7 C (98 F) (Temporal)   Resp 18   Ht 182.9 cm (6')   Wt 99 kg (218 lb 3.2 oz)   SpO2 97%   BMI 29.59 kg/m   Physical Exam Vitals and nursing note reviewed.  Constitutional:      General: He is in acute distress (Mild).  HENT:     Head: Normocephalic and atraumatic.   Cardiovascular:     Rate and Rhythm: Regular rhythm. Tachycardia present.  Pulmonary:     Effort: Pulmonary effort is normal.     Breath sounds: Normal breath sounds.  Abdominal:     Palpations: Abdomen is soft.     Tenderness: There is no abdominal tenderness.   Musculoskeletal:        General: No deformity.     Right lower leg: No edema.     Left lower leg: No edema.     Comments: Midline tenderness lower lumbar and right SI areas.  Positive straight leg raising on the right. Distal neurovascular intact in the lower extremities   Skin:    General: Skin is warm and dry.   Neurological:     General: No focal deficit present.      Mental Status: He is alert and oriented to person, place, and time.     Sensory: No sensory deficit.     Motor: No weakness.     ED Course   ED Course as of 07/24/23 1142  Fri Jul 24, 2023  1135 Unchanged on reevaluation     Medical Decision Making The patient presents after slipping and almost falling with subsequent back pain on a background of previous documentation of lumbar disc disease; the concern is for fracture dislocation or sciatica musculoskeletal strain  X-ray of the lumbar area showed no acute fracture or dislocation.  40 mg prednisone  ordered.  Patient will be treated as an outpatient with Flexeril and prednisone  and asked to follow-up.                [1] Past Medical History: Diagnosis Date  . Back pain   . DDD (degenerative disc disease), lumbar   [2] No past surgical history on file. [3] No family history on file. [4] Social History Socioeconomic History  . Marital status: Divorced  Tobacco Use  . Smoking status: Every Day    Current packs/day: 1.00    Types: Cigarettes  . Smokeless tobacco: Never  Vaping Use  . Vaping status: Some  Days  Substance and Sexual Activity  . Alcohol use: Yes  . Drug use: Never   Rosamond Lyndy Beagle, MD 07/24/23 [phone]

## 2023-07-24 NOTE — ED Triage Notes (Signed)
 Woke up this am, slipped while going to the bathroom. H/O DDD and generalized back pain. Since slipping has had increased pain to right lower leg

## 2023-08-24 NOTE — Progress Notes (Signed)
   08/25/2023 12:36 PM   Shane Castillo 1975-06-27 [id number]  Referring provider: Trudy Maeola Helling, DO [address] [place],  [place] [address]  No chief complaint on file.   HPI:    PMH: Past Medical History:  Diagnosis Date   Anxiety    Asthma    Panic attack     Surgical History: No past surgical history on file.  Home Medications:  Allergies as of 08/25/2023       Reactions   Aspirin Other (See Comments)   Triggered asthma attack-childhood        Medication List        Accurate as of August 24, 2023 12:36 PM. If you have any questions, ask your nurse or doctor.          cyclobenzaprine 10 MG tablet Commonly known as: FLEXERIL Take 10 mg by mouth 2 (two) times daily as needed for muscle spasms.   ibuprofen  200 MG tablet Commonly known as: ADVIL  Take 4 tablets (800 mg total) by mouth every 8 (eight) hours as needed.   traMADol  50 MG tablet Commonly known as: ULTRAM  Take 1 tablet (50 mg total) by mouth every 6 (six) hours as needed.        Allergies:  Allergies  Allergen Reactions   Aspirin Other (See Comments)    Triggered asthma attack-childhood    Family History: No family history on file.  Social History:  reports that he has been smoking cigarettes. He has a 15 pack-year smoking history. He has never used smokeless tobacco. He reports current alcohol use. He reports that he does not use drugs.  ROS: All other review of systems were reviewed and are negative except what is noted above in HPI  Physical Exam: There were no vitals taken for this visit.  Constitutional:  Alert and oriented, No acute distress. HEENT: [place] AT, moist mucus membranes.  Trachea midline, no masses. Cardiovascular: No clubbing, cyanosis, or edema. Respiratory: Normal respiratory effort, no increased work of breathing. GI: No inguinal hernias GU: Normal phallus. No masses/lesions on penis, testis, scrotum. Prostate ***g smooth no nodules no induration.   Lymph: No cervical or inguinal lymphadenopathy. Skin: No rashes, bruises or suspicious lesions. Neurologic: Grossly intact, no focal deficits, moving all 4 extremities. Psychiatric: Normal mood and affect.  Laboratory Data: Lab Results  Component Value Date   WBC 7.4 08/07/2019   HGB 17.9 (H) 08/07/2019   HCT 51.8 08/07/2019   MCV 90.6 08/07/2019   PLT 258 08/07/2019    Lab Results  Component Value Date   CREATININE 0.79 08/07/2019    No results found for: PSA  No results found for: TESTOSTERONE  No results found for: HGBA1C  Urinalysis   Pertinent Imaging: ***  Assessment:    Plan:    There are no diagnoses linked to this encounter.  No follow-ups on file.  Garnette CHRISTELLA Shack, MD  [facility] [place]

## 2023-08-25 ENCOUNTER — Ambulatory Visit (INDEPENDENT_AMBULATORY_CARE_PROVIDER_SITE_OTHER): Admitting: Urology

## 2023-08-25 ENCOUNTER — Encounter: Payer: Self-pay | Admitting: Urology

## 2023-08-25 VITALS — BP 107/67 | HR 114

## 2023-08-25 DIAGNOSIS — F17210 Nicotine dependence, cigarettes, uncomplicated: Secondary | ICD-10-CM | POA: Diagnosis not present

## 2023-08-25 DIAGNOSIS — N529 Male erectile dysfunction, unspecified: Secondary | ICD-10-CM | POA: Diagnosis not present

## 2023-08-25 DIAGNOSIS — N5319 Other ejaculatory dysfunction: Secondary | ICD-10-CM | POA: Diagnosis not present

## 2023-08-25 DIAGNOSIS — R300 Dysuria: Secondary | ICD-10-CM

## 2023-08-25 DIAGNOSIS — N5201 Erectile dysfunction due to arterial insufficiency: Secondary | ICD-10-CM

## 2023-08-25 LAB — MICROSCOPIC EXAMINATION

## 2023-08-25 LAB — URINALYSIS, ROUTINE W REFLEX MICROSCOPIC
Bilirubin, UA: NEGATIVE
Glucose, UA: NEGATIVE
Ketones, UA: NEGATIVE
Nitrite, UA: NEGATIVE
Protein,UA: NEGATIVE
Specific Gravity, UA: 1.03 (ref 1.005–1.030)
Urobilinogen, Ur: 0.2 mg/dL (ref 0.2–1.0)
pH, UA: 6 (ref 5.0–7.5)

## 2023-08-25 MED ORDER — TADALAFIL 20 MG PO TABS: 20.0000 mg | ORAL_TABLET | ORAL | 11 refills | Status: DC | PRN

## 2023-08-26 ENCOUNTER — Telehealth: Payer: Self-pay | Admitting: Urology

## 2023-08-26 ENCOUNTER — Other Ambulatory Visit: Payer: Self-pay

## 2023-08-26 DIAGNOSIS — N5201 Erectile dysfunction due to arterial insufficiency: Secondary | ICD-10-CM

## 2023-08-26 MED ORDER — TADALAFIL 20 MG PO TABS: 20.0000 mg | ORAL_TABLET | ORAL | 11 refills | Status: AC | PRN

## 2023-08-26 NOTE — Telephone Encounter (Signed)
 FYI Rx was printed eRx sent to [facility] in [place]

## 2023-08-26 NOTE — Telephone Encounter (Signed)
 [facility] did not receive RX for Tadalafil . Please send to [facility]

## 2024-02-24 NOTE — Progress Notes (Unsigned)
 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  The patient is a smoker.    - Ejaculatory disorder.  More than likely due to anxiety related factors  Plan:   - I think it worthwhile to put him on 20 mg of Cialis  on an as-needed basis  -Regarding ejaculatory disorder, I would recommend sex therapy    HPI:   7.29.2026: 49 year old male sent by Dr.Hasanaj for sexual difficulties.  The patient has been on daily Cialis , 5 mg for 2 to 3 months.  He began having problems obtaining and maintaining erection several months before that.  He states that the current dose of Cialis  does not necessarily help significantly.  He also has problems reaching climax.  This occurs with both masturbation and with consensual sex with his fiance.  Patient states that sometimes he will be having sex or masturbating for over half an hour with inability to reach climax.  He does have a history of anxiety and panic attacks.  He is not on any SSRIs.  He does smoke, has a 35-year pack history.  2.3.2026:    PMH: Past Medical History:  Diagnosis Date   Anxiety    Asthma    Panic attack     Surgical History: No past surgical history on file.  Home Medications:  Allergies as of 03/01/2024       Reactions   Aspirin Other (See Comments)   Triggered asthma attack-childhood        Medication List        Accurate as of February 24, 2024  2:53 PM. If you have any questions, ask your nurse or doctor.          cyclobenzaprine 10 MG tablet Commonly known as: FLEXERIL Take 10 mg by mouth 2 (two) times daily as needed for muscle spasms.   gabapentin 300 MG capsule Commonly known as: NEURONTIN Take 300 mg by mouth 3 (three) times daily.   ibuprofen  200 MG tablet Commonly known as: ADVIL  Take 4 tablets (800 mg total) by mouth every 8 (eight) hours as needed.   tadalafil  5 MG tablet Commonly known as: CIALIS  Take 5 mg by mouth daily.   tadalafil  20 MG tablet Commonly known as:  CIALIS  Take 1 tablet (20 mg total) by mouth as needed for erectile dysfunction.   traMADol  50 MG tablet Commonly known as: ULTRAM  Take 1 tablet (50 mg total) by mouth every 6 (six) hours as needed.        Allergies:  Allergies  Allergen Reactions   Aspirin Other (See Comments)    Triggered asthma attack-childhood    Family History: No family history on file.  Social History:  reports that he has been smoking cigarettes. He has a 15 pack-year smoking history. He has never used smokeless tobacco. He reports current alcohol use. He reports that he does not use drugs.  ROS: All other review of systems were reviewed and are negative except what is noted above in HPI  Physical Exam: There were no vitals taken for this visit.  Constitutional:  Alert and oriented, No acute distress. HEENT: [place] AT, moist mucus membranes.  Trachea midline, no masses. Cardiovascular: No clubbing, cyanosis, or edema. Respiratory: Normal respiratory effort, no increased work of breathing. GI: No inguinal hernias GU: Normal phallus. No masses/lesions on penis, testis, scrotum.  Lymph: No cervical or inguinal lymphadenopathy. Skin: No rashes, bruises or suspicious lesions. Neurologic: Grossly intact, no focal deficits, moving all 4 extremities. Psychiatric: Normal mood and  affect.  Laboratory Data: Lab Results  Component Value Date   WBC 7.4 08/07/2019   HGB 17.9 (H) 08/07/2019   HCT 51.8 08/07/2019   MCV 90.6 08/07/2019   PLT 258 08/07/2019    Lab Results  Component Value Date   CREATININE 0.79 08/07/2019    No results found for: PSA  No results found for: TESTOSTERONE  No results found for: HGBA1C         "

## 2024-03-01 ENCOUNTER — Ambulatory Visit: Admitting: Urology

## 2024-03-01 DIAGNOSIS — N5319 Other ejaculatory dysfunction: Secondary | ICD-10-CM

## 2024-03-01 DIAGNOSIS — N5201 Erectile dysfunction due to arterial insufficiency: Secondary | ICD-10-CM

## 2024-03-01 DIAGNOSIS — R300 Dysuria: Secondary | ICD-10-CM
